# Patient Record
Sex: Male | Born: 1945 | Race: White | Hispanic: No | Marital: Married | Smoking: Former smoker
Health system: Southern US, Community
[De-identification: ages and names within clinical notes are randomized; demographics above are authoritative.]

## PROBLEM LIST (undated history)

## (undated) DIAGNOSIS — I10 Essential (primary) hypertension: Secondary | ICD-10-CM

## (undated) DIAGNOSIS — Z9989 Dependence on other enabling machines and devices: Secondary | ICD-10-CM

## (undated) DIAGNOSIS — E7800 Pure hypercholesterolemia, unspecified: Secondary | ICD-10-CM

## (undated) DIAGNOSIS — G4733 Obstructive sleep apnea (adult) (pediatric): Secondary | ICD-10-CM

## (undated) DIAGNOSIS — K859 Acute pancreatitis without necrosis or infection, unspecified: Secondary | ICD-10-CM

## (undated) DIAGNOSIS — J449 Chronic obstructive pulmonary disease, unspecified: Secondary | ICD-10-CM

## (undated) DIAGNOSIS — R0602 Shortness of breath: Secondary | ICD-10-CM

## (undated) HISTORY — PX: CHOLECYSTECTOMY: SHX55

## (undated) HISTORY — DX: Chronic obstructive pulmonary disease, unspecified: J44.9

## (undated) HISTORY — PX: HERNIA REPAIR: SHX51

---

## 2001-02-20 ENCOUNTER — Encounter: Admission: RE | Admit: 2001-02-20 | Discharge: 2001-02-20 | Payer: Self-pay | Admitting: *Deleted

## 2007-06-23 ENCOUNTER — Ambulatory Visit (HOSPITAL_COMMUNITY): Admission: RE | Admit: 2007-06-23 | Discharge: 2007-06-24 | Payer: Self-pay | Admitting: Neurosurgery

## 2011-01-12 NOTE — Op Note (Signed)
NAMEJARETT, Clinton Young NO.:  [id number]   MEDICAL RECORD NO.:  [id number]          PATIENT TYPE:  OIB   LOCATION:  3172                         FACILITY:  [facility]   PHYSICIAN:  Donalee Citrin, M.D.        DATE OF BIRTH:  1946/01/08   DATE OF PROCEDURE:  06/23/2007  DATE OF DISCHARGE:                               OPERATIVE REPORT   PREOPERATIVE DIAGNOSIS:  Left sided L4-L5 radiculopathy from lumbar  stenosis, lateral recess stenosis at L4-5.   PROCEDURE:  Decompressive laminectomy L4-5 with microscopic dissection  of the left L4 and L5 nerve roots all on the left side only.   SURGEON:  Donalee Citrin, M.D.   ASSISTANT:  Reinaldo Meeker, M.D..   ANESTHESIA:  General endotracheal.   HISTORY OF PRESENT ILLNESS:  The patient is a very pleasant 65 year old  gentleman who has had longstanding left back, hip and posterior thigh  pain down to his ankle.  Patient has failed all forms of conservative  treatment with epidural steroid injections, physical therapy,  antiinflammatories __________ and preoperative imaging with MRI scan  showed lumbar spondylosis with lateral recess stenosis at L4-5 causing  marked foraminal stenosis at the L4 and L5 nerve roots.  The patient was  recommended laminectomy and foraminotomies of this level and side and  the risks and benefits of the operation were explained.  The patient  understood and agreed to proceed forward.   The patient was brought in the OR, was induced under general anesthesia,  placed supine on the Wilson frame.  The back was prepped and draped in  the usual sterile fashion.  Preoperative x-ray localized at the  appropriate level and after infiltration of 8 mL of Xylocaine with  epinephrine, a midline incision was made.  Bovie electrocautery was used  to take down subcutaneous tissue.  Subperiosteal dissection carried down  to lamina of L4 and L5.  Intraoperative x-ray confirmed localization to  the appropriate level and then  the entire lamina of L4 medial facet  complex, superior aspect of L5 was drilled down and 2 and 3 mm Kerrison  punches were used to complete the unilateral laminectomy centrally at  L4, exposed ligamentum flavum was remarkably hypertrophied, through the  undersurface of the 3 lamina to expose and enable palpation of the L3  pedicle.  Attention then taken marching inferiorly.  There was noted to  be marked ligamentous hypertrophy and severe facet arthropathy at disk  space and the thecal sac and proximal L5 nerve root as well as the  undersurface of the 4 root.  This was all teased away with a 4 Penfield  and removed in a piecemeal fashion.  There was noted to be marked  displacement and compression at the 5 root at the proximal aspect of the  foramen.  After the plane was developed, the foraminotomy was extended,  decompressing the roof in the L5 nerve root.  At the end of the  laminotomy, there was no further stenosis on the 5 root or the thecal  sac centrally.  Attention taken to mark  the L4 nerve root.  The superior  aspect of the facet was underbitten and undersurface of the pars  underbitten to identify the proximal takeoff of the L4 root so it could  be easily palpated and then once the L4 nerve root was identified and  visualized, a coronary dilator and hockey stick was easily passed up the  foramen.  The disk space was inspected.  It was noted to be spondylitic  but not overly compressive, so it was left alone.  The remainder of the  medial facet complex was underbitten and cleaned off.  The wound was  then copiously irrigated and meticulous hemostasis was maintained.  Gelfoam was laid on top of the  dura.  Muscle and fascia reapproximated in layers with interrupted  Vicryl and the skin was closed with a running 4-0 subcuticular.  Benzoin  and Steri-Strips applied.  The patient went to the recovery room in  stable condition.  At the end of the case, needle and sponge count   correct.           ______________________________  Donalee Citrin, M.D.     GC/MEDQ  D:  06/23/2007  T:  06/23/2007  Job:  [id number]

## 2011-06-09 LAB — CBC
HCT: 44
Hemoglobin: 14.7
MCHC: 33.3
MCV: 87.2
Platelets: 246
RBC: 5.04
RDW: 14.9 — ABNORMAL HIGH
WBC: 7.7

## 2011-06-09 LAB — BASIC METABOLIC PANEL
BUN: 15
CO2: 30
Calcium: 9.7
Chloride: 96
Creatinine, Ser: 1.26
GFR calc Af Amer: >60
GFR calc non Af Amer: 58 — ABNORMAL LOW
Glucose, Bld: 163 — ABNORMAL HIGH
Potassium: 3.8
Sodium: 138

## 2011-06-21 ENCOUNTER — Emergency Department (HOSPITAL_COMMUNITY): Payer: Medicare Other

## 2011-06-21 ENCOUNTER — Inpatient Hospital Stay (HOSPITAL_COMMUNITY)
Admission: EM | Admit: 2011-06-21 | Discharge: 2011-06-26 | DRG: 418 | Disposition: A | Discharge status: Home / Self Care | Payer: Medicare Other | Attending: Internal Medicine | Admitting: Internal Medicine

## 2011-06-21 DIAGNOSIS — K7689 Other specified diseases of liver: Secondary | ICD-10-CM | POA: Diagnosis present

## 2011-06-21 DIAGNOSIS — K8010 Calculus of gallbladder with chronic cholecystitis without obstruction: Secondary | ICD-10-CM | POA: Diagnosis present

## 2011-06-21 DIAGNOSIS — F3289 Other specified depressive episodes: Secondary | ICD-10-CM | POA: Diagnosis present

## 2011-06-21 DIAGNOSIS — E871 Hypo-osmolality and hyponatremia: Secondary | ICD-10-CM | POA: Diagnosis present

## 2011-06-21 DIAGNOSIS — E119 Type 2 diabetes mellitus without complications: Secondary | ICD-10-CM | POA: Diagnosis present

## 2011-06-21 DIAGNOSIS — N179 Acute kidney failure, unspecified: Secondary | ICD-10-CM | POA: Diagnosis present

## 2011-06-21 DIAGNOSIS — K859 Acute pancreatitis without necrosis or infection, unspecified: Principal | ICD-10-CM | POA: Diagnosis present

## 2011-06-21 DIAGNOSIS — I10 Essential (primary) hypertension: Secondary | ICD-10-CM | POA: Diagnosis present

## 2011-06-21 DIAGNOSIS — R112 Nausea with vomiting, unspecified: Secondary | ICD-10-CM | POA: Diagnosis present

## 2011-06-21 DIAGNOSIS — G4733 Obstructive sleep apnea (adult) (pediatric): Secondary | ICD-10-CM | POA: Diagnosis present

## 2011-06-21 DIAGNOSIS — M109 Gout, unspecified: Secondary | ICD-10-CM | POA: Diagnosis present

## 2011-06-21 DIAGNOSIS — F329 Major depressive disorder, single episode, unspecified: Secondary | ICD-10-CM | POA: Diagnosis present

## 2011-06-21 DIAGNOSIS — E785 Hyperlipidemia, unspecified: Secondary | ICD-10-CM | POA: Diagnosis present

## 2011-06-21 DIAGNOSIS — N289 Disorder of kidney and ureter, unspecified: Secondary | ICD-10-CM | POA: Diagnosis present

## 2011-06-21 LAB — GLUCOSE, CAPILLARY
Glucose-Capillary: 179 mg/dL — ABNORMAL HIGH (ref 70–99)
Glucose-Capillary: 176 mg/dL — ABNORMAL HIGH (ref 70–99)
Glucose-Capillary: 150 mg/dL — ABNORMAL HIGH (ref 70–99)

## 2011-06-21 LAB — CBC
MCH: 28.4 pg (ref 26.0–34.0)
Platelets: 214 10*3/uL (ref 150–400)
RBC: 4.61 MIL/uL (ref 4.22–5.81)
WBC: 11.3 10*3/uL — ABNORMAL HIGH (ref 4.0–10.5)

## 2011-06-21 LAB — DIFFERENTIAL
Basophils Absolute: 0 10*3/uL (ref 0.0–0.1)
Basophils Relative: 0 % (ref 0–1)
Eosinophils Absolute: 0 10*3/uL (ref 0.0–0.7)
Neutrophils Relative %: 93 % — ABNORMAL HIGH (ref 43–77)

## 2011-06-21 LAB — URINALYSIS, ROUTINE W REFLEX MICROSCOPIC
Leukocytes, UA: NEGATIVE
Nitrite: NEGATIVE
Protein, ur: NEGATIVE mg/dL
Specific Gravity, Urine: 1.017 (ref 1.005–1.030)
Urobilinogen, UA: 2 mg/dL — ABNORMAL HIGH (ref 0.0–1.0)

## 2011-06-21 LAB — LACTIC ACID, PLASMA: Lactic Acid, Venous: 1.5 mmol/L (ref 0.5–2.2)

## 2011-06-21 LAB — COMPREHENSIVE METABOLIC PANEL
CO2: 29 mEq/L (ref 19–32)
Calcium: 9.5 mg/dL (ref 8.4–10.5)
Creatinine, Ser: 1.56 mg/dL — ABNORMAL HIGH (ref 0.50–1.35)
GFR calc Af Amer: 52 mL/min — ABNORMAL LOW (ref >90)
GFR calc non Af Amer: 45 mL/min — ABNORMAL LOW (ref >90)
Glucose, Bld: 226 mg/dL — ABNORMAL HIGH (ref 70–99)
Total Protein: 6.9 g/dL (ref 6.0–8.3)

## 2011-06-21 MED ORDER — IOHEXOL 300 MG/ML  SOLN
100.0000 mL | Freq: Once | INTRAMUSCULAR | Status: AC | PRN
  Administered 2011-06-21: 100 mL via INTRAVENOUS

## 2011-06-22 LAB — CBC
MCV: 89.5 fL (ref 78.0–100.0)
Platelets: 174 10*3/uL (ref 150–400)
RDW: 16 % — ABNORMAL HIGH (ref 11.5–15.5)
WBC: 5.7 10*3/uL (ref 4.0–10.5)

## 2011-06-22 LAB — DIFFERENTIAL
Eosinophils Absolute: 0.1 10*3/uL (ref 0.0–0.7)
Eosinophils Relative: 2 % (ref 0–5)
Lymphs Abs: 0.4 10*3/uL — ABNORMAL LOW (ref 0.7–4.0)

## 2011-06-22 LAB — COMPREHENSIVE METABOLIC PANEL
Albumin: 3 g/dL — ABNORMAL LOW (ref 3.5–5.2)
BUN: 15 mg/dL (ref 6–23)
Creatinine, Ser: 1.38 mg/dL — ABNORMAL HIGH (ref 0.50–1.35)
Potassium: 3.8 mEq/L (ref 3.5–5.1)
Total Protein: 6.2 g/dL (ref 6.0–8.3)

## 2011-06-22 LAB — LIPID PANEL: LDL Cholesterol: 50 mg/dL (ref 0–99)

## 2011-06-22 LAB — PHOSPHORUS: Phosphorus: 2.7 mg/dL (ref 2.3–4.6)

## 2011-06-22 LAB — PROTIME-INR
INR: 1.13 (ref 0.00–1.49)
Prothrombin Time: 14.7 seconds (ref 11.6–15.2)

## 2011-06-22 LAB — HEMOGLOBIN A1C: Mean Plasma Glucose: 160 mg/dL — ABNORMAL HIGH (ref <117)

## 2011-06-22 LAB — LIPASE, BLOOD: Lipase: 34 U/L (ref 11–59)

## 2011-06-22 LAB — GLUCOSE, CAPILLARY
Glucose-Capillary: 156 mg/dL — ABNORMAL HIGH (ref 70–99)
Glucose-Capillary: 196 mg/dL — ABNORMAL HIGH (ref 70–99)
Glucose-Capillary: 147 mg/dL — ABNORMAL HIGH (ref 70–99)

## 2011-06-22 LAB — MAGNESIUM: Magnesium: 2 mg/dL (ref 1.5–2.5)

## 2011-06-22 NOTE — H&P (Signed)
NAMEANDREN, BETHEA NO.:  [id number]  MEDICAL RECORD NO.:  [id number]  LOCATION:  1513                         FACILITY:  [facility]  PHYSICIAN:  Kathlen Mody, MD       DATE OF BIRTH:  1945-12-14  DATE OF ADMISSION:  06/21/2011 DATE OF DISCHARGE:                             HISTORY & PHYSICAL   PRIMARY CARE PHYSICIAN:  None.  CHIEF COMPLAINT:  Right upper quadrant pain since last night.  HISTORY OF PRESENT ILLNESS:  This is a 65 year old gentleman with a history of hypertension, diabetes, sleep apnea, morbid obesity, came into [facility] ER with severe 8/10 abdominal pain, generalized but more in the right upper quadrant, nonradiating, constant, associated with nausea and 1 episode of nonbilious nonbloody vomiting.  Denies any diarrhea or constipation.  Last bowel movement was yesterday.  Over the last few weeks, the patient has mild right upper quadrant pain.  The patient on arrival was found to be febrile with temperature of 101. Denies and any cough or chest pain.  He has shortness of breath on exertion at baseline.  The patient is morbidly obese.  The patient denies any urinary complaints.  Denies any headache or blurry vision. Denies any tingling or numbness.  REVIEW OF SYSTEMS:  See HPI, otherwise negative.  PAST MEDICAL HISTORY: 1. Hypertension. 2. Spinal stenosis. 3. Diabetes. 4. Hyperlipidemia. 5. Sleep apnea. 6. Depression.  PAST SURGICAL HISTORY:  History of laminectomy at L4-L5, hernia repair, umbilical hernia.  ALLERGIES:  No known drug allergies.  FAMILY HISTORY:  Significant for coronary artery disease in his sister.  HOME MEDICATIONS: 1. Aspirin 81 mg. 2. Vitamin D3 1 tablet daily. 3. Tramadol 50 mg as needed. 4. Vitamin B12, 2000 mcg daily. 5. Pravastatin 40 mg 1 tablet daily. 6. Potassium chloride 20 mEq 1 tablet daily. 7. Metformin 1000 mg 1 tablet twice a day. 8. Lisinopril 10 mg half a tablet daily. 9. Ipratropium  inhaler. 10.Hydrochlorothiazide 25 mg 1 tablet daily. 11.Glipizide 5 mg 1 tablet daily twice with meals. 12.Linezolid nasal inhalation twice daily. 13.Bupropion 150 mg twice daily. 14.Aspirin 81 mg daily. 15.Allopurinol 100 mg 2 tablets daily.  PHYSICAL EXAMINATION:  VITAL SIGNS:  On arrival to the ER, he was found to have a temperature of 101, which came down to 99, respiratory rate of 18, pulse is 90, blood pressure 120/90, and saturating about 96% on 2 L of oxygen. GENERAL:  On exam, he is alert, afebrile, in mild distress from abdominal pain. HEENT:  Pupils reacting to light and accommodation.  Mild scleral icterus.  Dry mucous membranes. NECK:  JVD cannot be appreciated. CARDIOVASCULAR:  S1 and S2 heard. RESPIRATORY:  Decreased air entry bilateral, more on the right side. ABDOMEN:  Obese, soft, and generalized tenderness, more in the right upper quadrant.  No rebound tenderness.  No signs of peritonitis.  Bowel sounds are heard. EXTREMITIES:  Trace edema present.  No cyanosis or clubbing. NEUROLOGICAL:  Grossly intact.  PERTINENT LABORATORY DATA:  Comprehensive metabolic panel significant for sodium of 133, glucose of 226, creatinine of 1.56, bilirubin of 2.8, alkaline phosphatase of 250, AST of 236, ALT of 240, and lipase of 2224.  Urinalysis, negative for nitrites and leukocytes.  CBC significant for a slightly elevated WBC count of 11.3, hemoglobin of 13.1, hematocrit of 39.3, and platelets 240.  DIAGNOSTIC STUDIES:  He had a CT abdomen and pelvis done, which showed opacity within the right middle lobe on the superior most aspect of the lung windows, cannot be further characterized on this study, may represent scar versus atelectasis versus infiltrate, cholelithiasis, sigmoid diverticulosis.  No evidence of diverticulitis.  There is no gallbladder thickening or pericholecystic fluid.  No extra or intrahepatic biliary ductal dilatation.  Ultrasound of the abdomen shows  cholelithiasis without associated findings to suggest acute cholecystitis and hepatic steatosis.  ASSESSMENT AND PLAN: 1. This is a 65 year old gentleman with history of hypertension,     diabetes, and sleep apnea, admitted for abdominal pain, found to     have an elevated lipase is being admitted to hospitalist service     for evaluation and management of pancreatitis with CAT scan and     ultrasound showing multiple gallstones, could be secondary to     gallstone pancreatitis, but CBG does not appear to be dilated.  He     must have passed the stone. 2. We will start the patient on clear liquid diet, IV fluids, pain     control, and advance diet as tolerated. 3. Elevated LFTs, probably secondary to liver steatosis.  We will     repeat LFTs in a.m., hold pravastatin for now. 4. Elevated creatinine, not sure if this is his baseline or may be     this is acute renal insufficiency.  For now, we are going to hold     the metformin and lisinopril and see if his creatinine improves     with IV fluids in the next 24 to 48 hours.  Also hold tramadol. 5. Hypertension appears to be controlled. 6. Sleep apnea.  CPAP at night. 7. Deep venous thrombosis prophylaxis, subcutaneous Lovenox. 8. The patient is full code.          ______________________________ Kathlen Mody, MD     VA/MEDQ  D:  06/21/2011  T:  06/21/2011  Job:  [id number]  Electronically Signed by Kathlen Mody MD on 06/22/2011 03:05:39 PM

## 2011-06-23 DIAGNOSIS — K859 Acute pancreatitis without necrosis or infection, unspecified: Secondary | ICD-10-CM

## 2011-06-23 DIAGNOSIS — K8020 Calculus of gallbladder without cholecystitis without obstruction: Secondary | ICD-10-CM

## 2011-06-23 DIAGNOSIS — R110 Nausea: Secondary | ICD-10-CM

## 2011-06-23 LAB — COMPREHENSIVE METABOLIC PANEL
Albumin: 2.8 g/dL — ABNORMAL LOW (ref 3.5–5.2)
BUN: 14 mg/dL (ref 6–23)
Creatinine, Ser: 1.25 mg/dL (ref 0.50–1.35)
GFR calc Af Amer: 68 mL/min — ABNORMAL LOW (ref >90)
Total Bilirubin: 1.2 mg/dL (ref 0.3–1.2)
Total Protein: 5.9 g/dL — ABNORMAL LOW (ref 6.0–8.3)

## 2011-06-23 LAB — GLUCOSE, CAPILLARY
Glucose-Capillary: 150 mg/dL — ABNORMAL HIGH (ref 70–99)
Glucose-Capillary: 206 mg/dL — ABNORMAL HIGH (ref 70–99)
Glucose-Capillary: 144 mg/dL — ABNORMAL HIGH (ref 70–99)

## 2011-06-23 NOTE — Discharge Summary (Signed)
Clinton Young, Clinton Young NO.:  [id number]  MEDICAL RECORD NO.:  [id number]  LOCATION:  1513                         FACILITY:  [facility]  PHYSICIAN:  Kathlen Mody, MD       DATE OF BIRTH:  15-Aug-1946  DATE OF ADMISSION:  06/21/2011 DATE OF DISCHARGE:  06/23/2011                              DISCHARGE SUMMARY   Date of discharge possibly June 23, 2011.  PRIMARY CARE PHYSICIAN:  None.  DISCHARGE DIAGNOSES: 1. Gallstone pancreatitis, must have passed a stone. 2. Cholelithiasis. 3. Elevated liver function test secondary to liver steatosis. 4. Hypertension. 5. Diabetes. 6. Hyperlipidemia. 7. Depression. 8. Gout. 9. Renal insufficiency.  CONSULTATIONS:  None.  PROCEDURES:  None.  PERTINENT LABORATORY DATA:  On admission, the patient had a comprehensive metabolic panel significant for sodium of 133, creatinine 1.56, glucose 226.  Elevated alkaline phosphatase of 250, AST 236, ALT 240, total bilirubin 2.8, lipase 2224.  Urinalysis negative for nitrites and leukocytes.  CBC showed a slight elevation of white count to 11.3. Lactic acid was normal.  BNP was 527.  Repeat labs showed a normal lipase level the next day, the comprehensive metabolic panel showed decrease in LFTs with alkaline phosphatase of 190, AST 114, ALT 204, bilirubin still at 3.  Hemoglobin A1c of 7.2.  TSH of 0.808.  Lipid profile showing HDL of 30.  DIAGNOSTIC STUDIES:  The patient had a 2-view chest x-ray that showed bibasilar scarring, otherwise clear.  CT abdomen and pelvis with contrast shows opacity within the right middle lobe and the superior- most cuts of the lung windows, possible scar versus atelectasis versus infiltrate cholelithiasis.  Sigmoid diverticulosis without evidence of diverticulitis.  Ultrasound of the abdomen shows cholelithiasis, no gallbladder thickening or pericholecystic fluid, no sonographic evidence of Murphy sign, common bile duct measures 5 mm.  BRIEF  HOSPITAL COURSE:  This is a 65 year old gentleman with history of hypertension, diabetes, sleep apnea, morbid obesity, admitted for right upper quadrant pain associated with nausea and vomiting.  He was found to have elevated lipase in the 2000s and elevated LFTs.  He was admitted for further evaluation and management of acute pancreatitis, possibly gallstone pancreatitis.  CT, abdomen and pelvis did not show any CBD stones or any choledocholithiasis.  Over the next 24 hours, his lipase had normalized.  His abdominal pain, nausea, and vomiting almost resolved, apparently must have passed a gallstone.  His LFTs are improving, but they are still elevated.  We will check his LFTs in the morning; if they are trending down he can be discharged home to follow up with his PCP and further referral to a gastroenterologist to follow his LFTs.  If his LFTs remain high, we will get a hepatitis panel and further recommendations as per the clinical course. 1. Hypertension, controlled.  Continue with his home medications. 2. Hyponatremia.  Mild, most likely secondary to hydrochlorothiazide,     which we have held. 3. Diabetes.  Continue with sliding scale insulin.  Metformin was held     secondary to elevated creatinine. 4. Renal insufficiency.  Not sure if it is his baseline or if it is     acute-on-chronic renal  insufficiency.  His creatinine has improved     in 24 hours with IV fluids.  We will check his renal function in     a.m.; if it has normalized we can restart his metformin on     discharge. 5. Abnormal chest x-ray.  Initially, we thought the patient had     attached pneumonia, but on further questioning the patient was told     he had similar abnormal chest x-ray findings about 3 months ago, by     his [place] doctor.  His leukocytosis has resolved.  He has been     afebrile.  We can discontinue the antibiotics on discharge.  DISCHARGE MEDICATIONS:  To be dictated by the discharging  physician.          ______________________________ Kathlen Mody, MD     VA/MEDQ  D:  06/22/2011  T:  06/22/2011  Job:  [id number]  Electronically Signed by Kathlen Mody MD on 06/23/2011 11:13:19 PM

## 2011-06-24 DIAGNOSIS — K8020 Calculus of gallbladder without cholecystitis without obstruction: Secondary | ICD-10-CM

## 2011-06-24 DIAGNOSIS — K859 Acute pancreatitis without necrosis or infection, unspecified: Secondary | ICD-10-CM

## 2011-06-24 LAB — CBC
HCT: 36.2 % — ABNORMAL LOW (ref 39.0–52.0)
MCH: 27.8 pg (ref 26.0–34.0)
MCV: 88.3 fL (ref 78.0–100.0)
Platelets: 165 10*3/uL (ref 150–400)
RBC: 4.1 MIL/uL — ABNORMAL LOW (ref 4.22–5.81)

## 2011-06-24 LAB — COMPREHENSIVE METABOLIC PANEL
ALT: 108 U/L — ABNORMAL HIGH (ref 0–53)
AST: 46 U/L — ABNORMAL HIGH (ref 0–37)
BUN: 12 mg/dL (ref 6–23)
CO2: 27 mEq/L (ref 19–32)
Calcium: 8.7 mg/dL (ref 8.4–10.5)
Creatinine, Ser: 1.22 mg/dL (ref 0.50–1.35)
GFR calc Af Amer: 70 mL/min — ABNORMAL LOW (ref >90)
GFR calc non Af Amer: 60 mL/min — ABNORMAL LOW (ref >90)
Glucose, Bld: 136 mg/dL — ABNORMAL HIGH (ref 70–99)
Sodium: 135 mEq/L (ref 135–145)
Total Bilirubin: 0.8 mg/dL (ref 0.3–1.2)

## 2011-06-24 LAB — GLUCOSE, CAPILLARY
Glucose-Capillary: 148 mg/dL — ABNORMAL HIGH (ref 70–99)
Glucose-Capillary: 145 mg/dL — ABNORMAL HIGH (ref 70–99)

## 2011-06-25 ENCOUNTER — Other Ambulatory Visit (INDEPENDENT_AMBULATORY_CARE_PROVIDER_SITE_OTHER): Payer: Self-pay | Admitting: General Surgery

## 2011-06-25 DIAGNOSIS — K8010 Calculus of gallbladder with chronic cholecystitis without obstruction: Secondary | ICD-10-CM

## 2011-06-25 DIAGNOSIS — K824 Cholesterolosis of gallbladder: Secondary | ICD-10-CM

## 2011-06-25 LAB — GLUCOSE, CAPILLARY
Glucose-Capillary: 138 mg/dL — ABNORMAL HIGH (ref 70–99)
Glucose-Capillary: 216 mg/dL — ABNORMAL HIGH (ref 70–99)
Glucose-Capillary: 246 mg/dL — ABNORMAL HIGH (ref 70–99)

## 2011-06-25 LAB — CBC
HCT: 36.4 % — ABNORMAL LOW (ref 39.0–52.0)
Hemoglobin: 11.7 g/dL — ABNORMAL LOW (ref 13.0–17.0)
MCH: 27.9 pg (ref 26.0–34.0)
MCV: 86.9 fL (ref 78.0–100.0)
RBC: 4.19 MIL/uL — ABNORMAL LOW (ref 4.22–5.81)
WBC: 3.7 10*3/uL — ABNORMAL LOW (ref 4.0–10.5)

## 2011-06-25 LAB — BASIC METABOLIC PANEL
BUN: 14 mg/dL (ref 6–23)
CO2: 28 mEq/L (ref 19–32)
Calcium: 9.2 mg/dL (ref 8.4–10.5)
Chloride: 100 mEq/L (ref 96–112)
Creatinine, Ser: 1.06 mg/dL (ref 0.50–1.35)
Glucose, Bld: 160 mg/dL — ABNORMAL HIGH (ref 70–99)

## 2011-06-26 LAB — HEPATIC FUNCTION PANEL
Albumin: 3 g/dL — ABNORMAL LOW (ref 3.5–5.2)
Alkaline Phosphatase: 182 U/L — ABNORMAL HIGH (ref 39–117)
Indirect Bilirubin: 0.4 mg/dL (ref 0.3–0.9)
Total Bilirubin: 0.6 mg/dL (ref 0.3–1.2)
Total Protein: 6.6 g/dL (ref 6.0–8.3)

## 2011-06-26 LAB — CBC
HCT: 37.7 % — ABNORMAL LOW (ref 39.0–52.0)
Hemoglobin: 11.8 g/dL — ABNORMAL LOW (ref 13.0–17.0)
MCH: 27.5 pg (ref 26.0–34.0)
MCHC: 31.3 g/dL (ref 30.0–36.0)
RDW: 15.8 % — ABNORMAL HIGH (ref 11.5–15.5)

## 2011-06-26 LAB — GLUCOSE, CAPILLARY: Glucose-Capillary: 162 mg/dL — ABNORMAL HIGH (ref 70–99)

## 2011-06-26 NOTE — Consult Note (Signed)
Clinton Young, Clinton Young NO.:  [id number]  MEDICAL RECORD NO.:  [id number]  LOCATION:  1513                         FACILITY:  [facility]  PHYSICIAN:  Juanetta Gosling, MDDATE OF BIRTH:  1946-08-09  DATE OF CONSULTATION: DATE OF DISCHARGE:                                CONSULTATION   CONSULTING PHYSICIAN:  Kathlen Mody, MD  Consulting reason is for gallstone pancreatitis.  GASTROENTEROLOGIST:  Rachael Fee, MD  This is a 65 year old male began having abdominal pain on Sunday night that he had never had before.  He had a subjective fever as well as some nausea.  He denied any emesis.  There was no relieving factors, so he decided to come to the emergency room as it continued to get worse.  He has been admitted since June 21, 2011.  At this point his initial laboratory evaluation showed him to have a total bilirubin of 2.8, alkaline phosphatase 250, AST 236, ALT 240, and creatinine of 1.56.  His lipase on admission was 2224.  His white count upon admission was 11.3. He underwent a CT scan of his abdomen, which showed gallstones that are present as well as some sigmoid diverticulosis.  He also has undergone an ultrasound his abdomen which shows cholelithiasis with no gallbladder wall thickening, pericholecystic fluid, or sonographic Murphy sign.  His common bile duct measured 5 mm and no other findings that are noted.  He has been managed conservatively and his pancreatitis has resolved both clinically and by his laboratory evaluation.  He was evaluated by Gastroenterology and they recommend consulting surgery for cholecystectomy.  PAST MEDICAL HISTORY: 1. COPD. 2. Hypertension. 3. Diabetes. 4. Obstructive sleep apnea, on a CPAP machine.  PAST SURGICAL HISTORY: 1. Lumbar laminectomy. 2. Right groin hernia repair. 3. Umbilical hernia repair, which he thinks mesh was used.  MEDICATIONS: 1. Aspirin. 2. Tramadol. 3. Pravastatin. 4. Metformin. 5.  Lisinopril. 6. Hydrochlorothiazide. 7. Glipizide. 8. Ipratropium. 9. Aspirin. 10.Allopurinol.  ALLERGIES:  None known.  SOCIAL HISTORY:  Occasional alcohol.  He is a former smoker.  He is retired.  He gets most of his primary care at the [place].  PHYSICAL EXAMINATION:  VITAL SIGNS:  97.8, 87, 156/82, 20, and 96% on room air. GENERAL: He is an obese male, in no distress. HEENT:  He has no scleral icterus. NECK:  Supple without adenopathy. LUNGS:  Have occasional expiratory wheezes. HEART:  Regular rate and rhythm. ABDOMEN:  Obese.  He has a well-healed right groin and umbilical incision.  He is nontender and nondistended with bowel sounds that are present.  His last laboratory evaluation shows a PT of 14.7 and INR of 1.13.  His albumin is 2.8, alkaline phosphatase 177, AST 62, ALT 137, total bilirubin 1.2, and creatinine 1.25.  A CT scan and ultrasound both showed cholelithiasis.  ASSESSMENT:  Gallstone pancreatitis.  PLAN:  Pancreatitis is clinic resolved.  I have recommended to him that he would proceed with cholecystectomy prior to his discharge to prevent any further episodes.  I drew a diagram.  We discussed the pathophysiology for the gallstones and pancreatitis as well as pancreatitis secondary to gallstones.  We discussed the risks of  surgery including the medical risks with his other comorbidities as well as bleeding, infection, open procedure, and common bile duct injury or other bowel or vascular injury requiring repair.  I am going to make him n.p.o. tonight and we will attempt to get him to the operating room tomorrow.     Juanetta Gosling, MD     MCW/MEDQ  D:  06/23/2011  T:  06/23/2011  Job:  [id number]  cc:   Kathlen Mody, MD  Rachael Fee, MD [address] [place], [place] [address]  Electronically Signed by Emelia Loron MD on 06/26/2011 11:38:17 AM

## 2011-06-26 NOTE — Op Note (Signed)
NAMEBRAYDIN, Clinton Young NO.:  [id number]  MEDICAL RECORD NO.:  [id number]  LOCATION:  1513                         FACILITY:  [facility]  PHYSICIAN:  Juanetta Gosling, MDDATE OF BIRTH:  February 28, 1946  DATE OF PROCEDURE:  06/25/2011 DATE OF DISCHARGE:                              OPERATIVE REPORT   PREOPERATIVE DIAGNOSIS:  Gallstone pancreatitis, resolved.  POSTOPERATIVE DIAGNOSIS:  Gallstone pancreatitis, resolved.  PROCEDURE:  Laparoscopic cholecystectomy.  SURGEON:  Troy Sine. Dwain Sarna, MD  ASSISTANT:  Letha Cape, PA  ANESTHESIA:  General.  SUPERVISING ANESTHESIOLOGIST:  Jenelle Mages. Fortune, MD  SPECIMENS:  Gallbladder contents to Pathology.  ESTIMATED BLOOD LOSS:  Minimal.  COMPLICATIONS:  None.  DRAINS:  None.  DISPOSITION:  The patient to recovery room in stable condition.  INDICATIONS:  This is a 65 year old male with a recent onset of gallstone pancreatitis.  His pancreatitis has resolved.  We discussed a laparoscopic cholecystectomy prior to discharge with the risks and benefits associated with that.  PROCEDURE IN DETAIL:  After informed consent was obtained, the patient was first administered Unasyn.  He had sequential compression devices on lower extremities prior to induction with anesthesia.  He was then placed under general anesthesia without complication.  His abdomen was prepped and draped in the standard sterile surgical fashion.  Surgical time-out was then performed.  I accessed his abdomen due to a prior umbilical hernia repair via the left upper quadrant using a 5-mm trocar camera with the Optiview technique.  There was no evidence of an entry injury.  His abdomen was insufflated to 15 mmHg pressure.  I then inserted a 10-mm trocar above the umbilicus under direct vision without complication.  Three further 5- mm trocars were placed in epigastrium and right upper quadrant after infiltration of local anesthetic under  direct vision without complication.  We then retracted his gallbladder cephalad and lateral. He had a lot of scarring in his triangle of Calot, indicative of his pancreatitis and also very difficult to get this space due to his body habitus and very large omentum.  Eventually, I was able to dissect the triangle of Calot, identified the artery as well as the cystic duct.  I wanted to do a cholangiogram due to his gallstone pancreatitis, but his gallbladder was very friable and I made a rent in his gallbladder right at the cystic duct junction.  I had the critical view of safety at this point and I elected to just clip the duct and divide it.  At that point, I did not do a cholangiogram as I was beginning to spill some stones while I was doing this.  I was able to control this and I did evacuate all the stones.  I treated the artery in the similar fashion.  I then removed the gallbladder from the liver bed with cautery without difficulty.  This was placed in an Endocatch bag and removed from the umbilicus.  I did place an Endoloop on the cystic duct remnant without difficulty.  Hemostasis was then observed.  Irrigation was performed.  I evacuated all the irrigant as well as all the stones that I could identify as well.  I  then placed a piece of Surgicel SNoW in the liver bed.  I then removed the 10-mm trocar.  I did close this with a #1 Vicryl suture and this completely obliterated the defect.  I then desufflated the abdomen and removed all trocars.  These were closed with 4-0 Monocryl and Dermabond.  He tolerated this well, was extubated in the operating room and transferred to the recovery room.     Juanetta Gosling, MD     MCW/MEDQ  D:  06/25/2011  T:  06/25/2011  Job:  [id number]  cc:   Rachael Fee, MD [address] [place], [place] [address]  Electronically Signed by Emelia Loron MD on 06/26/2011 11:38:29 AM

## 2011-06-30 NOTE — Discharge Summary (Signed)
Clinton, Young NO.:  [id number]  MEDICAL RECORD NO.:  [id number]  LOCATION:  1513                         FACILITY:  [facility]  PHYSICIAN:  Hartley Barefoot, MD    DATE OF BIRTH:  1946/07/06  DATE OF ADMISSION:  06/21/2011 DATE OF DISCHARGE:  06/26/2011                              DISCHARGE SUMMARY   DISCHARGE DIAGNOSES: 1. Gallstone pancreatitis, status post cholecystectomy. 2. Cholecystectomy. 3. Hypertension. 4. Diabetes. 5. Hyperlipidemia. 6. Depression. 7. Gout. 8. Acute kidney injury, resolved now.  DISCHARGE MEDICATIONS: 1. Norvasc 5 mg p.o. daily. 2. Docusate 100 mg p.o. b.i.d. 3. Tramadol 50 mg 1 tablet every 8 hours as needed for pain. 4. Allopurinol 2 tablets by mouth daily. 5. Aspirin 81 mg p.o. daily. 6. Bupropion 150 p.o. b.i.d. 7. Vitamin B12 500 mcg 4 tablets by mouth daily. 8. Flunisolide Nasal 1 spray inhaled b.i.d. 9. Glipizide 500 mg p.o. b.i.d. with meals. 10.Hydrochlorothiazide 1 tab by mouth daily. 11.Ipratropium 2 puffs inhaled b.i.d. 12.Vitamin D3 1000 units 1-2 tab by mouth daily. Medications stopped during this hospitalization:  Lisinopril, metformin, pravastatin, potassium.  DISPOSITION/FOLLOWUP: 1. Clinton Young will need to follow with Dr. Dwain Young, Surgery within 1     or 2 weeks to follow liver function test to see the patient will     require ERCP or further studies. 2. He will need to follow up with his primary care physician at     [facility] for a BMET to follow kidney function.     Consider restart metformin if creatinine less than 1.4 and consider     restart lisinopril if renal function is stable.  HOSPITAL COURSE: 1. Gallstone pancreatitis, status post cholecystectomy.  The patient     was admitted with pancreatitis and increased liver function test.     GI and Surgery was consulted.  The patient had a cholecystectomy on     October 26.  He tolerated the procedure well.  He is  tolerating     diet.  He will be discharged on October 27.  He will need to follow     up with the Surgery to repeat liver function test and if abnormal,     he might need further studies.  He can follow up also with Dr.     Gerilyn Young. 2. Acute kidney injury improved with IV fluids.  I will discharge him     off lisinopril.  He can follow up with his primary care physician.     Consider restart lisinopril if his renal function is stable.  His     creatinine peaked to 1.5 and decreased to 1.0 on October 26.  We     are going to discharge him on his hydrochlorothiazide.  I add     Norvasc for his blood pressure. 3. Diabetes:  We are going to discharge him on glipizide.  I will     continue to hold metformin at this time.  Make sure that his kidney     function is stable, prior to restarting his metformin.  He can     follow up with his primary care physician for this.  On  day of     discharge, the patient was in improved condition. 4. Obstructive sleep apnea.  Continue with CPAP. 5. Morbid obesity.  Counseling was provided.  LABORATORY DATA:  Labs prior to discharge:  On October 26, sodium 136, potassium 4.0, chloride 100, bicarb 28, glucose 160, BUN 14, creatinine 1.06.  White blood cell 7.5, hemoglobin 11.8, platelets 223, AST 85, ALT 159, albumin 3.0, alkaline phosphatase 182, lipase 25.  Vital signs; blood pressure 150/79, saturation 100% on 2 L, respiration 18, pulse 78, temperature 98.0.     Hartley Barefoot, MD     BR/MEDQ  D:  06/26/2011  T:  06/26/2011  Job:  [id number]  Electronically Signed by Hartley Barefoot MD on 06/30/2011 08:58:26 PM

## 2011-07-26 ENCOUNTER — Encounter (INDEPENDENT_AMBULATORY_CARE_PROVIDER_SITE_OTHER): Payer: Self-pay | Admitting: General Surgery

## 2011-07-26 ENCOUNTER — Ambulatory Visit (INDEPENDENT_AMBULATORY_CARE_PROVIDER_SITE_OTHER): Payer: Medicare Other | Admitting: General Surgery

## 2011-07-26 VITALS — BP 122/70 | HR 64 | Temp 98.3°F | Resp 24 | Ht 71.0 in | Wt 292.4 lb

## 2011-07-26 DIAGNOSIS — E119 Type 2 diabetes mellitus without complications: Secondary | ICD-10-CM | POA: Insufficient documentation

## 2011-07-26 DIAGNOSIS — Z09 Encounter for follow-up examination after completed treatment for conditions other than malignant neoplasm: Secondary | ICD-10-CM

## 2011-07-26 DIAGNOSIS — J449 Chronic obstructive pulmonary disease, unspecified: Secondary | ICD-10-CM | POA: Insufficient documentation

## 2011-07-26 NOTE — Progress Notes (Signed)
Subjective:     Patient ID: Clinton Young, male   DOB: [birth year], 65 y.o.   MRN: [id number]  HPI This is a 65 year old male who I met while he was admitted to the hospital for gallstone pancreatitis. He underwent a laparoscopic cholecystectomy after his pancreatitis resolved. He returns today doing well without any significant complaints. He is eating well. He has some occasional loose bowel movements and constipation. Otherwise he is mostly back to his normal activities.  Review of Systems     Objective:   Physical Exam Well healed incisions without infection    Assessment:     S/p lap chole for GSP    Plan:     He is doing well. I return to full activity today. His pathology showed the expected cholelithiasis cholesterolosis. I'm going to see him back as needed.

## 2011-07-26 NOTE — Patient Instructions (Signed)
Return to full activity.  

## 2013-03-04 IMAGING — CR DG CHEST 2V
2 series · 2 of 2 positions shown · non-contrast
Comparison: Chest radiograph performed 06/19/2007

CLINICAL DATA: Shortness of breath; history of smoking.

CHEST - 2 VIEW

[w chest pa]
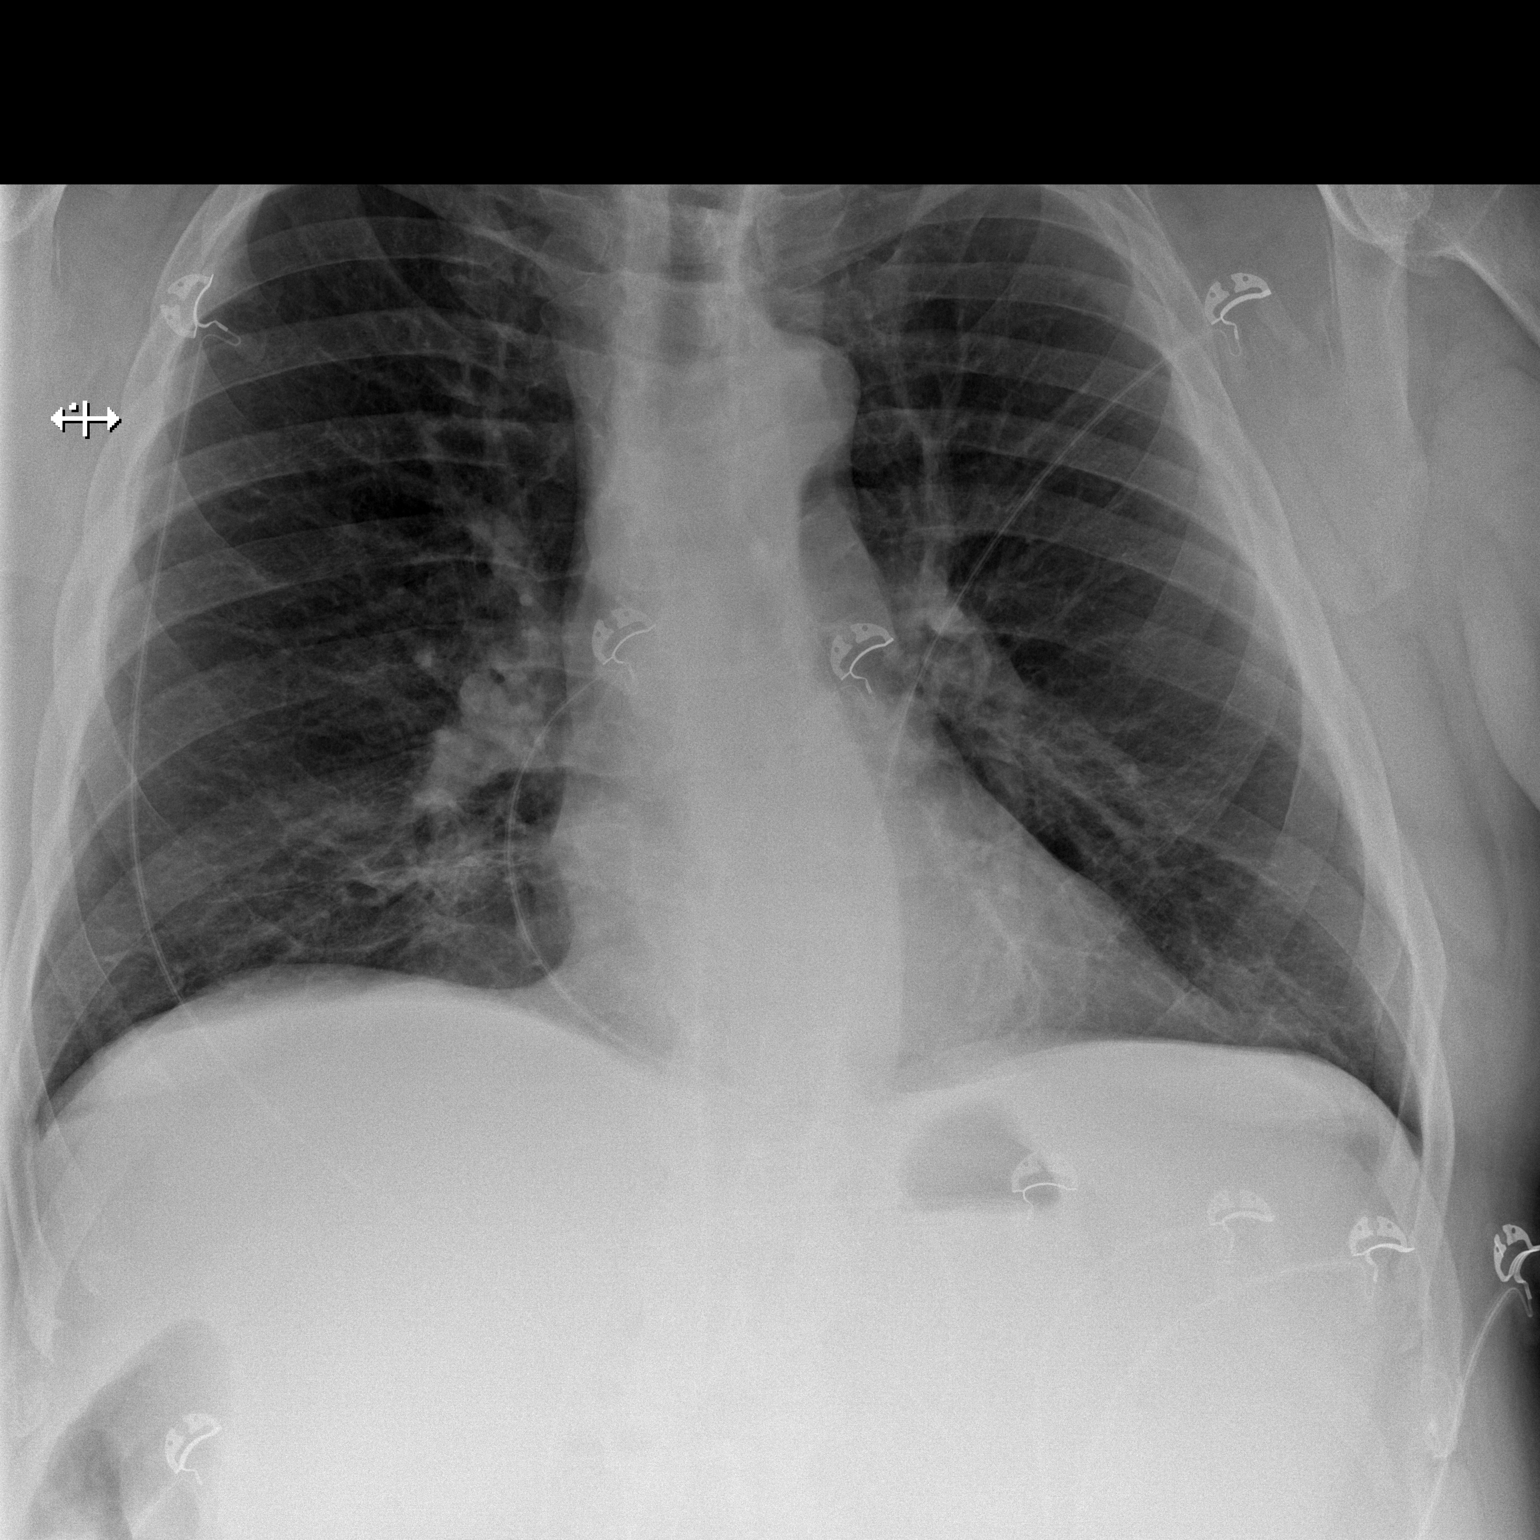

[w chest lat]
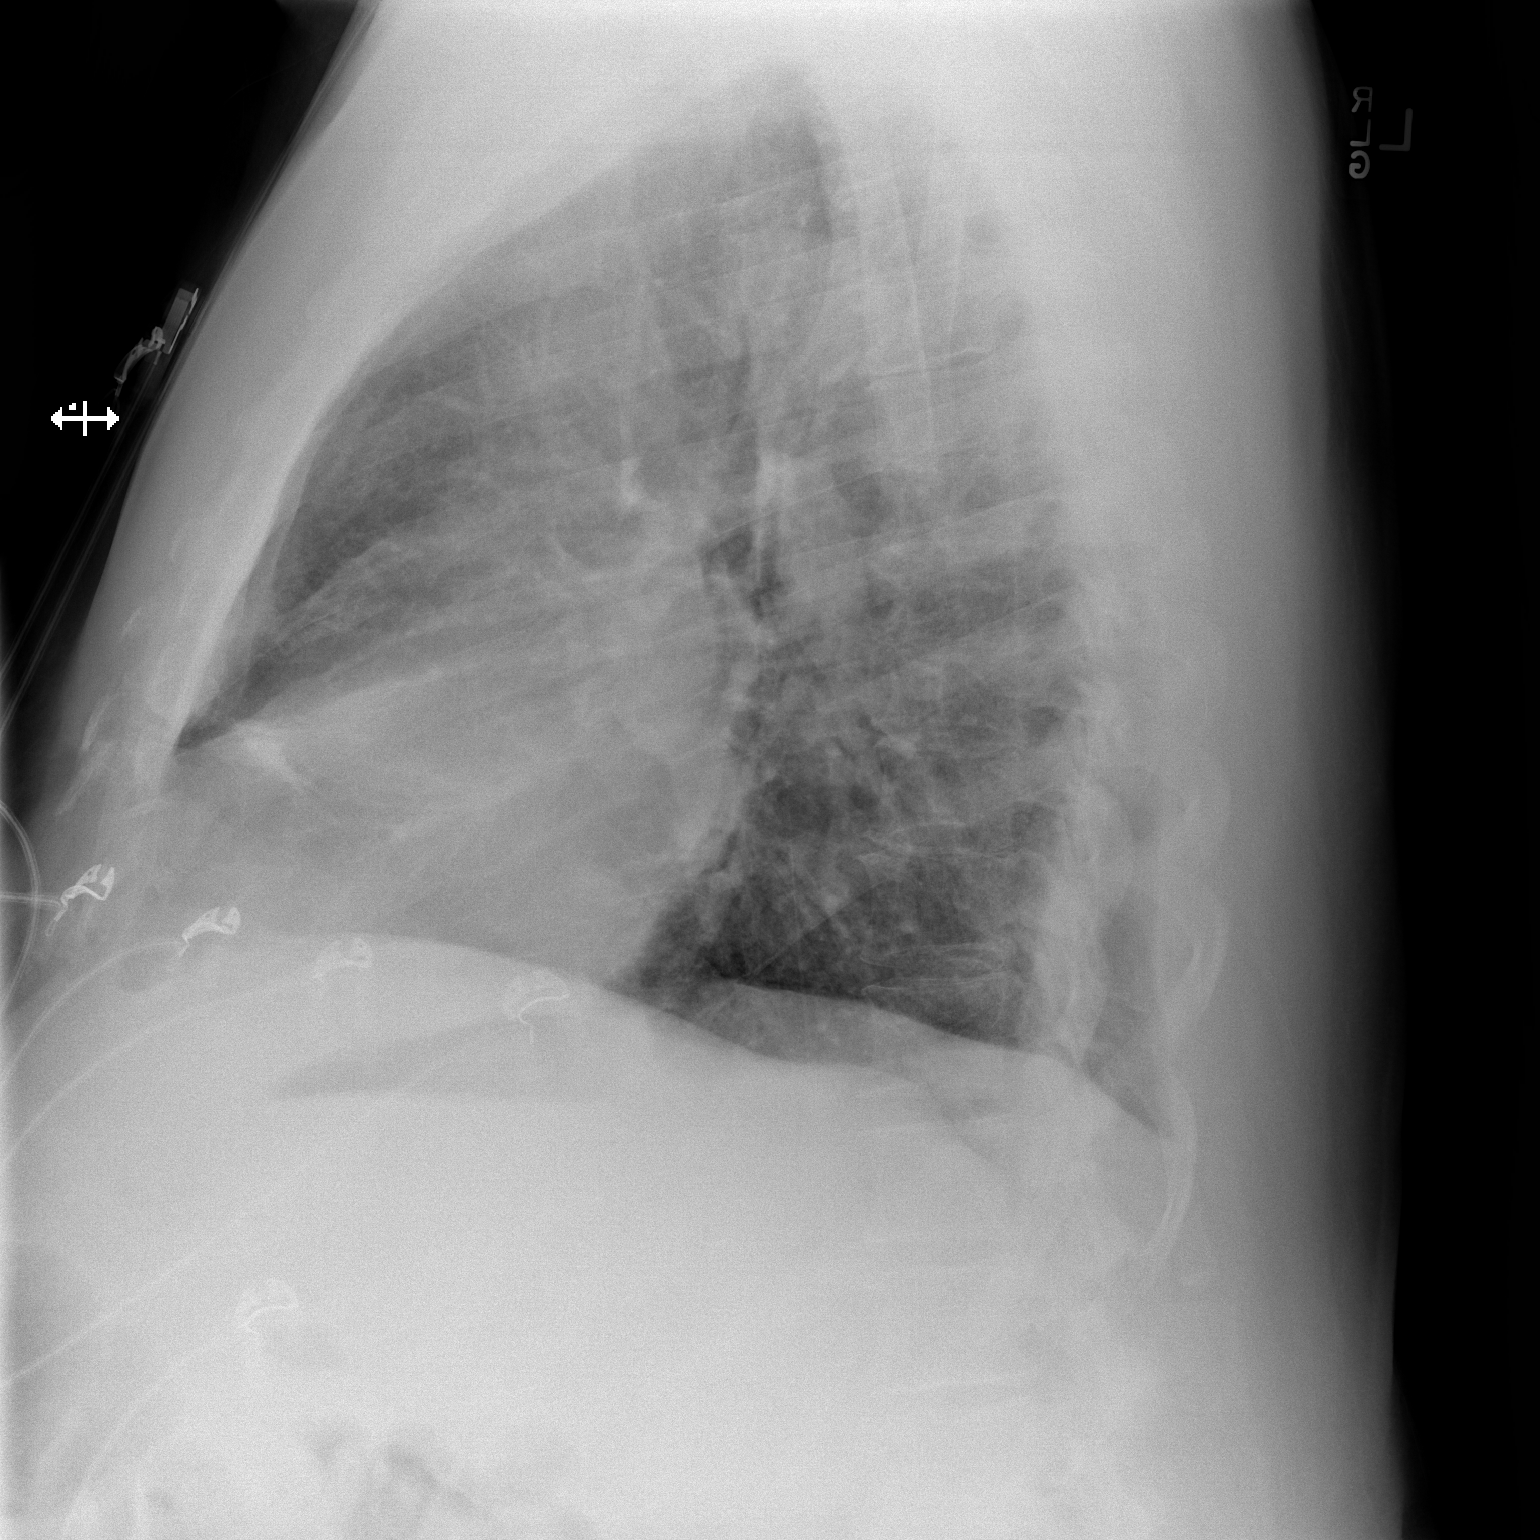

[2 of 2 positions shown; findings below may reference images not displayed]

FINDINGS: The lungs are well-aerated.  Bibasilar scarring is
unchanged in appearance.  The lungs are otherwise grossly clear.
There is no evidence of focal opacification, pleural effusion or
pneumothorax.

The heart is normal in size; the mediastinal contour is within
normal limits.  No acute osseous abnormalities are seen.
IMPRESSION: Stable appearance to bibasilar scarring; lungs otherwise grossly
clear.

## 2013-03-04 IMAGING — US US ABDOMEN PORT
1 series · 14 of 25 positions shown · non-contrast
Comparison: CT dated 06/21/2011

CLINICAL DATA: Abdominal pain, increased LFTs

COMPLETE ABDOMINAL ULTRASOUND

[Series 1: us abdomen port · 0.39mm/px · 14 of 74 slices shown]
[im 1/74]
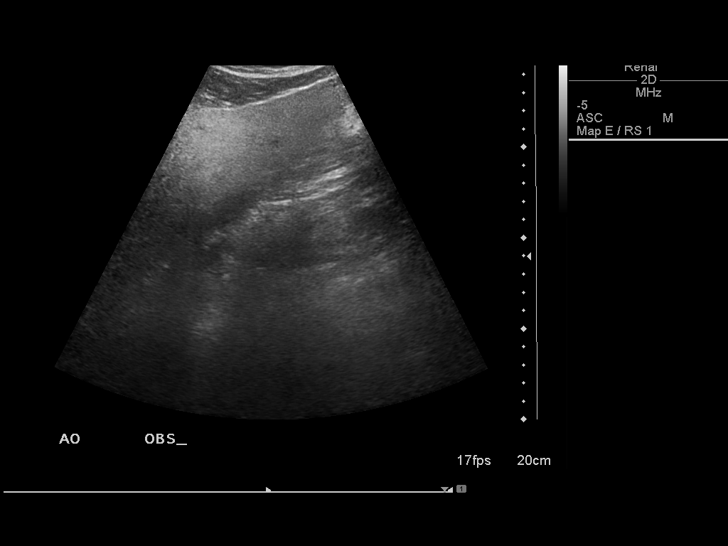
[im 7/74]
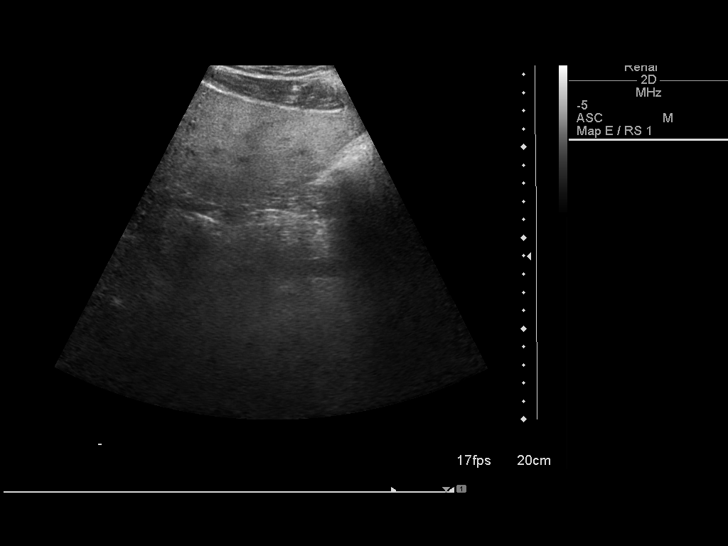
[im 13/74]
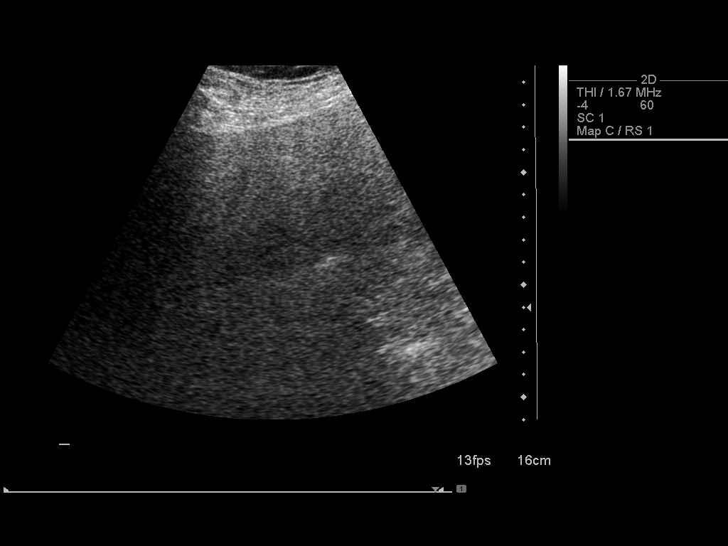
[im 19/74]
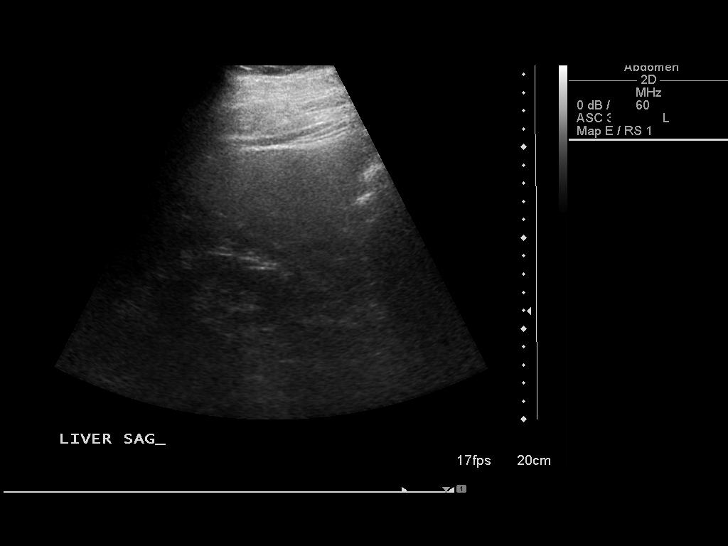
[im 25/74]
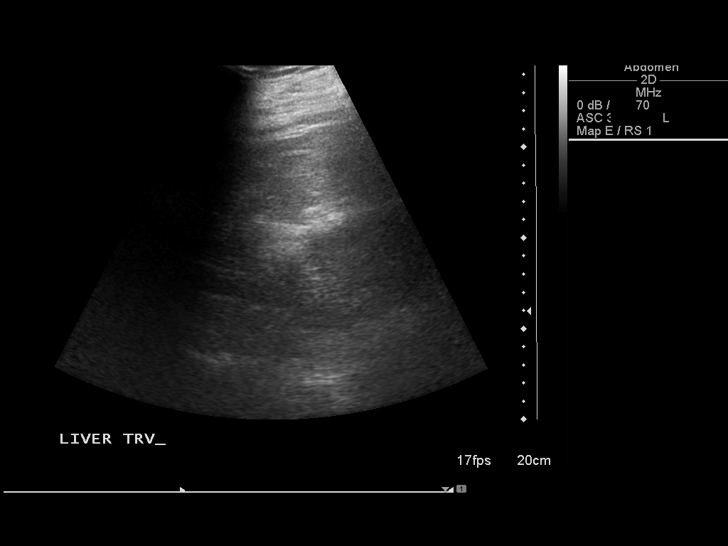
[im 28/74]
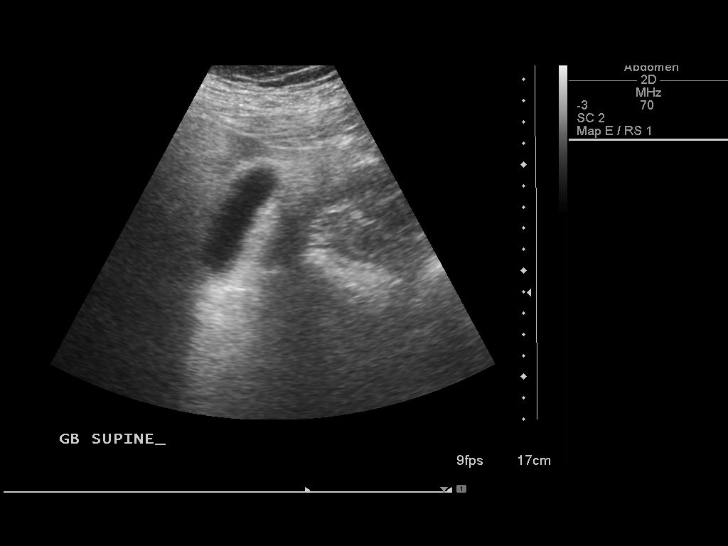
[im 34/74]
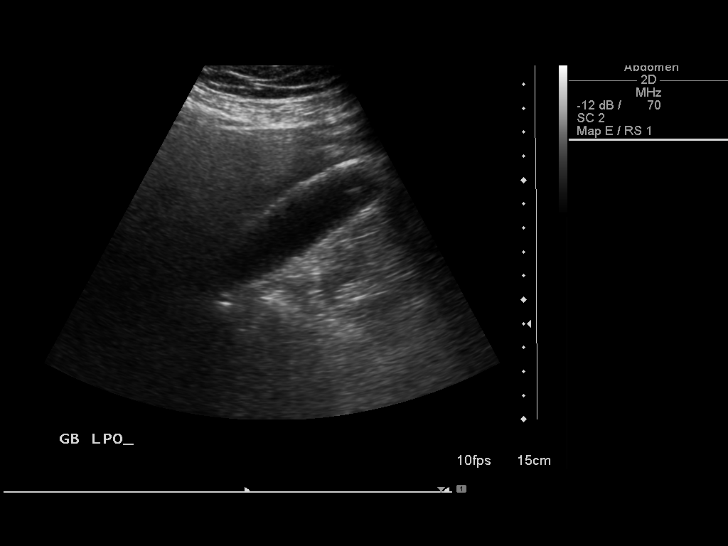
[im 40/74]
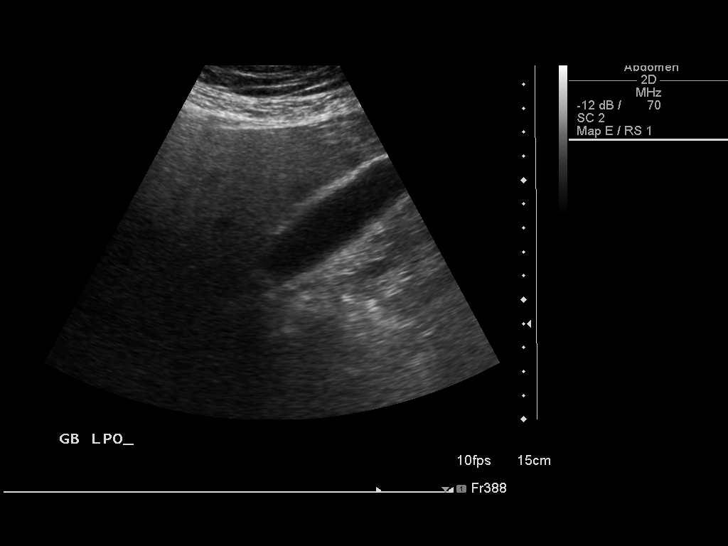
[im 46/74]
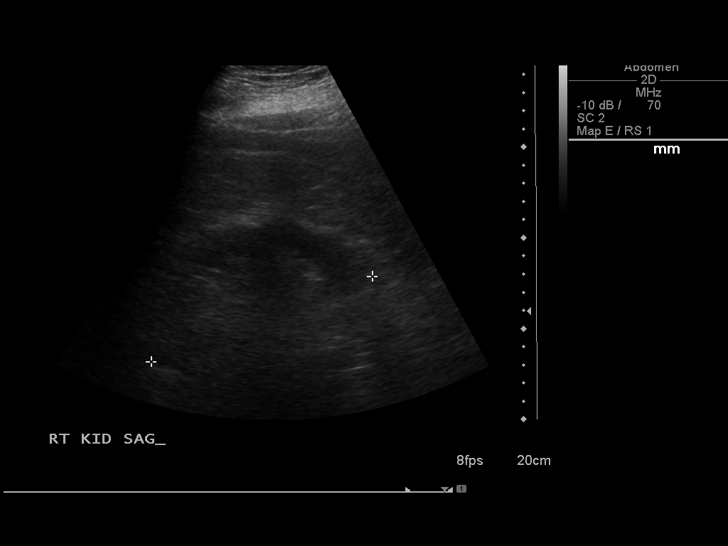
[im 49/74]
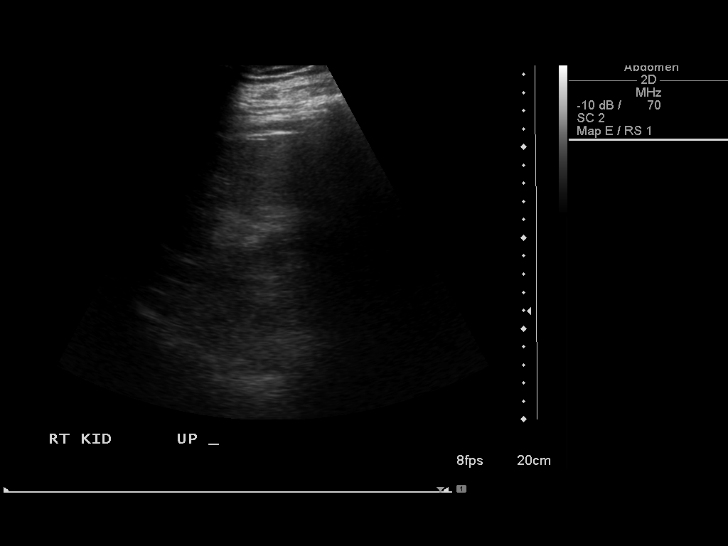
[im 55/74]
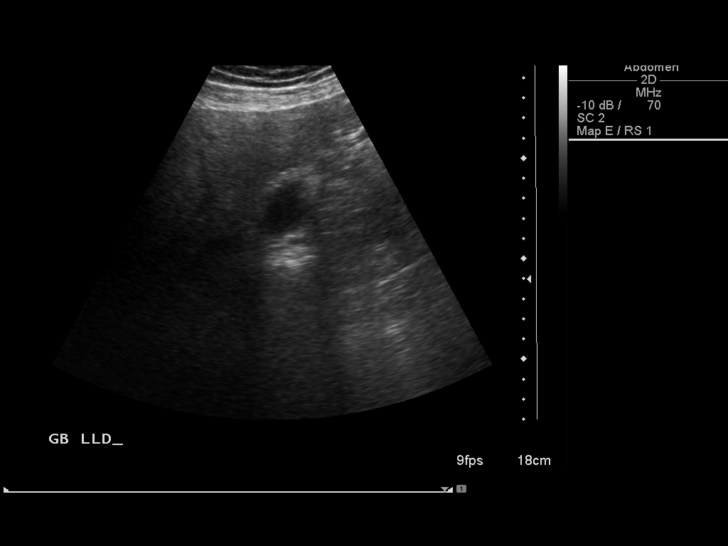
[im 61/74]
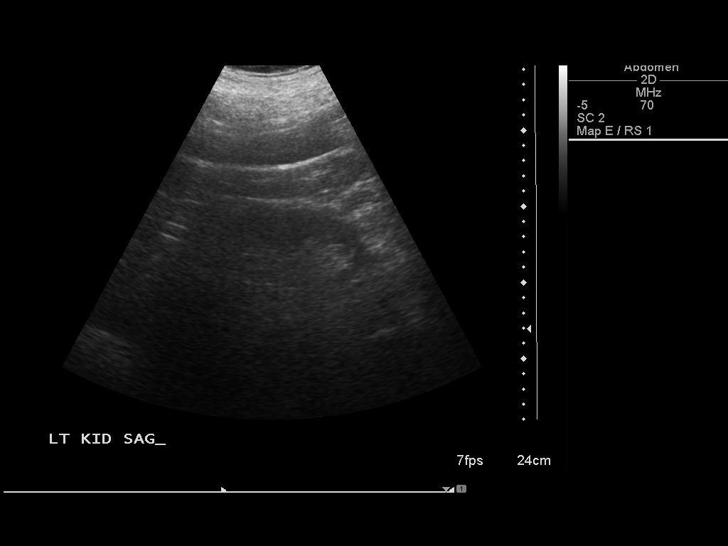
[im 67/74]
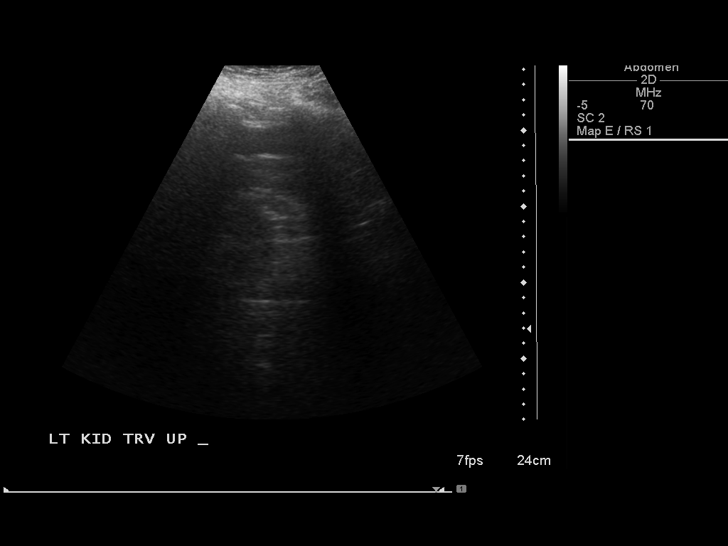
[im 74/74]
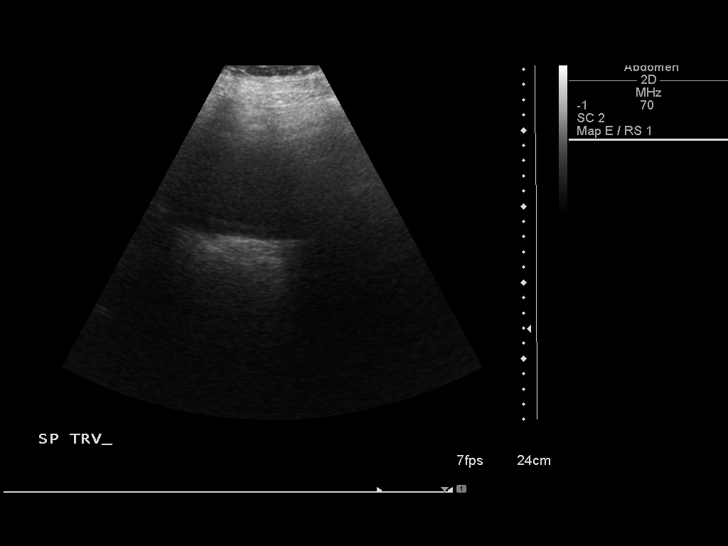

[14 of 25 positions shown; findings below may reference images not displayed]

FINDINGS: Gallbladder:  Cholelithiasis in the gallbladder neck.  No
associated gallbladder wall thickening, pericholecystic fluid, or
sonographic Murphy's sign.

Common bile duct:  Measures 5 mm.

Liver:  Coarse, hyperechoic hepatic parenchyma, compatible with
hepatic steatosis.  No focal hepatic lesions.

IVC:  Not visualized.

Pancreas:  Poorly visualized due to overlying bowel gas/portable
technique.

Spleen:  Measures 11.2 cm.

Right Kidney:  Measures 13.1 cm.  No mass or hydronephrosis.

Left Kidney:  Measures 14.3 cm.  No mass or hydronephrosis.

Abdominal aorta:  Not visualized.
IMPRESSION: Cholelithiasis, without associated findings to suggest acute
cholecystitis.  Negative sonographic Murphy's sign.

Hepatic steatosis.

## 2013-06-05 ENCOUNTER — Encounter (HOSPITAL_COMMUNITY): Payer: Self-pay | Admitting: Emergency Medicine

## 2013-06-05 ENCOUNTER — Inpatient Hospital Stay (HOSPITAL_COMMUNITY)
Admission: EM | Admit: 2013-06-05 | Discharge: 2013-06-08 | DRG: 439 | Disposition: A | Discharge status: Home / Self Care | Payer: Medicare Other | Attending: Family Medicine | Admitting: Family Medicine

## 2013-06-05 ENCOUNTER — Emergency Department (HOSPITAL_COMMUNITY): Payer: Medicare Other

## 2013-06-05 DIAGNOSIS — Z87891 Personal history of nicotine dependence: Secondary | ICD-10-CM

## 2013-06-05 DIAGNOSIS — I10 Essential (primary) hypertension: Secondary | ICD-10-CM | POA: Diagnosis present

## 2013-06-05 DIAGNOSIS — E7800 Pure hypercholesterolemia, unspecified: Secondary | ICD-10-CM | POA: Diagnosis present

## 2013-06-05 DIAGNOSIS — K859 Acute pancreatitis without necrosis or infection, unspecified: Principal | ICD-10-CM | POA: Diagnosis present

## 2013-06-05 DIAGNOSIS — N390 Urinary tract infection, site not specified: Secondary | ICD-10-CM | POA: Diagnosis present

## 2013-06-05 DIAGNOSIS — Z6839 Body mass index (BMI) 39.0-39.9, adult: Secondary | ICD-10-CM

## 2013-06-05 DIAGNOSIS — Z66 Do not resuscitate: Secondary | ICD-10-CM | POA: Diagnosis not present

## 2013-06-05 DIAGNOSIS — E119 Type 2 diabetes mellitus without complications: Secondary | ICD-10-CM | POA: Diagnosis present

## 2013-06-05 DIAGNOSIS — J4489 Other specified chronic obstructive pulmonary disease: Secondary | ICD-10-CM | POA: Diagnosis present

## 2013-06-05 DIAGNOSIS — J449 Chronic obstructive pulmonary disease, unspecified: Secondary | ICD-10-CM

## 2013-06-05 DIAGNOSIS — N179 Acute kidney failure, unspecified: Secondary | ICD-10-CM | POA: Diagnosis present

## 2013-06-05 DIAGNOSIS — G4733 Obstructive sleep apnea (adult) (pediatric): Secondary | ICD-10-CM | POA: Diagnosis present

## 2013-06-05 HISTORY — DX: Pure hypercholesterolemia, unspecified: E78.00

## 2013-06-05 HISTORY — DX: Essential (primary) hypertension: I10

## 2013-06-05 HISTORY — DX: Acute pancreatitis without necrosis or infection, unspecified: K85.90

## 2013-06-05 HISTORY — DX: Obstructive sleep apnea (adult) (pediatric): G47.33

## 2013-06-05 HISTORY — DX: Shortness of breath: R06.02

## 2013-06-05 HISTORY — DX: Dependence on other enabling machines and devices: Z99.89

## 2013-06-05 LAB — HEMOGLOBIN A1C: Mean Plasma Glucose: 143 mg/dL — ABNORMAL HIGH (ref <117)

## 2013-06-05 LAB — PROTIME-INR
INR: 0.97 (ref 0.00–1.49)
Prothrombin Time: 12.7 seconds (ref 11.6–15.2)

## 2013-06-05 LAB — COMPREHENSIVE METABOLIC PANEL
Albumin: 3.7 g/dL (ref 3.5–5.2)
Alkaline Phosphatase: 224 U/L — ABNORMAL HIGH (ref 39–117)
BUN: 19 mg/dL (ref 6–23)
Calcium: 9.2 mg/dL (ref 8.4–10.5)
Creatinine, Ser: 1.31 mg/dL (ref 0.50–1.35)
GFR calc Af Amer: 63 mL/min — ABNORMAL LOW (ref >90)
Glucose, Bld: 259 mg/dL — ABNORMAL HIGH (ref 70–99)
Potassium: 4.3 mEq/L (ref 3.5–5.1)
Total Protein: 7 g/dL (ref 6.0–8.3)

## 2013-06-05 LAB — CBC WITH DIFFERENTIAL/PLATELET
Basophils Relative: 0 % (ref 0–1)
Eosinophils Absolute: 0 10*3/uL (ref 0.0–0.7)
Eosinophils Relative: 0 % (ref 0–5)
Hemoglobin: 15.3 g/dL (ref 13.0–17.0)
Lymphs Abs: 0.7 10*3/uL (ref 0.7–4.0)
MCH: 28.8 pg (ref 26.0–34.0)
MCHC: 33.3 g/dL (ref 30.0–36.0)
MCV: 86.6 fL (ref 78.0–100.0)
Monocytes Absolute: 1.3 10*3/uL — ABNORMAL HIGH (ref 0.1–1.0)
Monocytes Relative: 6 % (ref 3–12)
Neutrophils Relative %: 91 % — ABNORMAL HIGH (ref 43–77)
RBC: 5.31 MIL/uL (ref 4.22–5.81)

## 2013-06-05 LAB — GLUCOSE, CAPILLARY
Glucose-Capillary: 226 mg/dL — ABNORMAL HIGH (ref 70–99)
Glucose-Capillary: 198 mg/dL — ABNORMAL HIGH (ref 70–99)
Glucose-Capillary: 150 mg/dL — ABNORMAL HIGH (ref 70–99)

## 2013-06-05 LAB — LIPASE, BLOOD: Lipase: >3000 U/L — ABNORMAL HIGH (ref 11–59)

## 2013-06-05 LAB — CG4 I-STAT (LACTIC ACID): Lactic Acid, Venous: 2.66 mmol/L — ABNORMAL HIGH (ref 0.5–2.2)

## 2013-06-05 LAB — TROPONIN I
Troponin I: <0.3 ng/mL (ref <0.30)
Troponin I: <0.3 ng/mL (ref <0.30)

## 2013-06-05 MED ORDER — ACETAMINOPHEN 325 MG PO TABS: 650.0000 mg | ORAL_TABLET | Freq: Four times a day (QID) | ORAL | Status: DC | PRN

## 2013-06-05 MED ORDER — SIMVASTATIN 20 MG PO TABS
20.0000 mg | ORAL_TABLET | Freq: Every day | ORAL | Status: DC
  Administered 2013-06-05 – 2013-06-07 (×3): 20 mg via ORAL
  Filled 2013-06-05 (×4): qty 1

## 2013-06-05 MED ORDER — ACETAMINOPHEN 650 MG RE SUPP: 650.0000 mg | Freq: Four times a day (QID) | RECTAL | Status: DC | PRN

## 2013-06-05 MED ORDER — IOHEXOL 300 MG/ML  SOLN
100.0000 mL | Freq: Once | INTRAMUSCULAR | Status: AC | PRN
  Administered 2013-06-05: 100 mL via INTRAVENOUS

## 2013-06-05 MED ORDER — PANTOPRAZOLE SODIUM 40 MG IV SOLR
40.0000 mg | Freq: Every day | INTRAVENOUS | Status: DC
  Administered 2013-06-05 – 2013-06-07 (×3): 40 mg via INTRAVENOUS
  Filled 2013-06-05 (×4): qty 40

## 2013-06-05 MED ORDER — FENTANYL CITRATE 0.05 MG/ML IJ SOLN
100.0000 ug | Freq: Once | INTRAMUSCULAR | Status: AC
  Administered 2013-06-05: 100 ug via INTRAVENOUS
  Filled 2013-06-05: qty 2

## 2013-06-05 MED ORDER — INSULIN ASPART 100 UNIT/ML ~~LOC~~ SOLN
0.0000 [IU] | SUBCUTANEOUS | Status: DC
Start: 2013-06-05 — End: 2013-06-08
  Administered 2013-06-05 (×2): 1 [IU] via SUBCUTANEOUS
  Administered 2013-06-05: 3 [IU] via SUBCUTANEOUS
  Administered 2013-06-05 – 2013-06-06 (×2): 2 [IU] via SUBCUTANEOUS
  Administered 2013-06-06: 1 [IU] via SUBCUTANEOUS
  Administered 2013-06-06: 2 [IU] via SUBCUTANEOUS
  Administered 2013-06-06: 1 [IU] via SUBCUTANEOUS

## 2013-06-05 MED ORDER — ALBUTEROL SULFATE (5 MG/ML) 0.5% IN NEBU: 2.5000 mg | INHALATION_SOLUTION | RESPIRATORY_TRACT | Status: DC | PRN

## 2013-06-05 MED ORDER — SODIUM CHLORIDE 0.9 % IV BOLUS (SEPSIS)
1000.0000 mL | Freq: Once | INTRAVENOUS | Status: AC
  Administered 2013-06-05: 1000 mL via INTRAVENOUS

## 2013-06-05 MED ORDER — ONDANSETRON HCL 4 MG PO TABS: 4.0000 mg | ORAL_TABLET | Freq: Four times a day (QID) | ORAL | Status: DC | PRN

## 2013-06-05 MED ORDER — IPRATROPIUM BROMIDE 0.02 % IN SOLN: 0.5000 mg | Freq: Four times a day (QID) | RESPIRATORY_TRACT | Status: DC | PRN

## 2013-06-05 MED ORDER — HYDROCODONE-ACETAMINOPHEN 5-325 MG PO TABS
1.0000 | ORAL_TABLET | ORAL | Status: DC | PRN
  Administered 2013-06-06: 2 via ORAL
  Filled 2013-06-05: qty 2

## 2013-06-05 MED ORDER — HYDROMORPHONE HCL PF 1 MG/ML IJ SOLN
2.0000 mg | INTRAMUSCULAR | Status: DC | PRN
  Administered 2013-06-05 (×2): 2 mg via INTRAVENOUS
  Filled 2013-06-05 (×2): qty 2

## 2013-06-05 MED ORDER — HYDROMORPHONE HCL PF 1 MG/ML IJ SOLN
1.0000 mg | Freq: Once | INTRAMUSCULAR | Status: AC
  Administered 2013-06-05: 1 mg via INTRAVENOUS
  Filled 2013-06-05: qty 1

## 2013-06-05 MED ORDER — ONDANSETRON HCL 4 MG/2ML IJ SOLN
4.0000 mg | Freq: Four times a day (QID) | INTRAMUSCULAR | Status: DC | PRN
  Administered 2013-06-05: 4 mg via INTRAVENOUS
  Filled 2013-06-05: qty 2

## 2013-06-05 MED ORDER — HYDROMORPHONE HCL PF 1 MG/ML IJ SOLN
1.0000 mg | INTRAMUSCULAR | Status: DC | PRN
  Administered 2013-06-05 (×2): 1 mg via INTRAVENOUS
  Filled 2013-06-05 (×2): qty 1

## 2013-06-05 MED ORDER — HYDRALAZINE HCL 20 MG/ML IJ SOLN: 10.0000 mg | INTRAMUSCULAR | Status: DC | PRN

## 2013-06-05 MED ORDER — SODIUM CHLORIDE 0.9 % IV SOLN
INTRAVENOUS | Status: AC
  Administered 2013-06-05 (×2): via INTRAVENOUS

## 2013-06-05 MED ORDER — SODIUM CHLORIDE 0.9 % IJ SOLN
3.0000 mL | Freq: Two times a day (BID) | INTRAMUSCULAR | Status: DC
  Administered 2013-06-05 – 2013-06-06 (×2): 3 mL via INTRAVENOUS

## 2013-06-05 MED ORDER — MORPHINE SULFATE 2 MG/ML IJ SOLN
2.0000 mg | INTRAMUSCULAR | Status: DC | PRN
  Administered 2013-06-05 – 2013-06-06 (×4): 2 mg via INTRAVENOUS
  Filled 2013-06-05 (×4): qty 1

## 2013-06-05 MED ORDER — FENTANYL CITRATE 0.05 MG/ML IJ SOLN
50.0000 ug | Freq: Once | INTRAMUSCULAR | Status: AC
  Administered 2013-06-05: 50 ug via INTRAVENOUS
  Filled 2013-06-05: qty 2

## 2013-06-05 MED ORDER — ONDANSETRON HCL 4 MG/2ML IJ SOLN
4.0000 mg | Freq: Once | INTRAMUSCULAR | Status: AC
  Administered 2013-06-05: 4 mg via INTRAVENOUS
  Filled 2013-06-05: qty 2

## 2013-06-05 NOTE — ED Notes (Signed)
Pt began having epigastric/CP at 4pm this afternoon. Pt reports pain is 10/10. Pt has had 2 episodes of emesis and nausea with symptoms. CBG 172. PMH: Cholecystectomy, HTN, DM, Obstructive Sleep Apnea, COPD.

## 2013-06-05 NOTE — ED Notes (Signed)
Admitting at bedside 

## 2013-06-05 NOTE — Progress Notes (Signed)
Pt very nauseated/dizzy/feeling loopy with 2mg  Dilaudid, and 1mg  Dilaudid was not helping pain. Notified MD of this, and MD ordered 2mg  Morphine q2hrs. Will continue to monitor.

## 2013-06-05 NOTE — Progress Notes (Signed)
Utilization Review Completed.Clinton Young T10/02/2013

## 2013-06-05 NOTE — Progress Notes (Signed)
Patient admitted early this AM by Dr. Adela Glimpse. Please see H&P.   severe pancreatitis" -IVF -pain control -NPO GB out in 2012  Marlin Canary DO

## 2013-06-05 NOTE — ED Provider Notes (Signed)
CSN: [id number]     Arrival date & time 06/05/13  0025 History   First MD Initiated Contact with Patient 06/05/13 0047     Chief Complaint  Patient presents with  . Abdominal Pain  . Chest Pain   (Consider location/radiation/quality/duration/timing/severity/associated sxs/prior Treatment) HPI Clinton Young presents with gradual onset of periumbilical pain that has worsened since 4 PM yesterday afternoon. He's had 2 episodes of vomiting and continues to be nauseated. He states he had a bowel movement which was watery earlier in the day. He has passed minimal gas. No definite fevers or chills but he does admit diaphoresis. He's had a previous history of cholecystectomy and hernia repair. No blood in the vomit or stool. He has no pain in his chest or shortness of breath. Patient states the pain is much worse with any movement or palpation of the abdomen. States that the bumpy ride in the ambulance was very painful. Past Medical History  Diagnosis Date  . COPD (chronic obstructive pulmonary disease)   . Diabetes mellitus    Past Surgical History  Procedure Laterality Date  . Cholecystectomy     No family history on file. History  Substance Use Topics  . Smoking status: Never Smoker   . Smokeless tobacco: Never Used  . Alcohol Use: No    Review of Systems  Constitutional: Positive for diaphoresis. Negative for fever and chills.  Respiratory: Negative for cough and shortness of breath.   Cardiovascular: Negative for chest pain.  Gastrointestinal: Positive for nausea, vomiting, abdominal pain and diarrhea. Negative for constipation and blood in stool.  Genitourinary: Negative for dysuria.  Musculoskeletal: Negative for back pain.  Skin: Negative for rash and wound.  Neurological: Negative for dizziness, weakness, light-headedness, numbness and headaches.  All other systems reviewed and are negative.    Allergies  Review of patient's allergies indicates no known allergies.  Home  Medications   Current Outpatient Rx  Name  Route  Sig  Dispense  Refill  . allopurinol (ZYLOPRIM) 100 MG tablet   Oral   Take 200 mg by mouth daily.          Marland Kitchen aspirin 81 MG tablet   Oral   Take 81 mg by mouth daily.           Marland Kitchen buPROPion (ZYBAN) 150 MG 12 hr tablet   Oral   Take 150 mg by mouth 2 (two) times daily.           . Cholecalciferol (VITAMIN D3) 1000 UNITS CAPS   Oral   Take 2,000 Units by mouth daily.          . flunisolide (NASALIDE) 0.025 % SOLN   Inhalation   Inhale 2 sprays into the lungs 2 (two) times daily.           Marland Kitchen glipiZIDE (GLUCOTROL) 10 MG tablet   Oral   Take 10 mg by mouth 2 (two) times daily before a meal.         . IPRATROPIUM BROMIDE IN   Inhalation   Inhale 17 mcg into the lungs.           Boris Lown Oil 300 MG CAPS   Oral   Take 300 mg by mouth 2 (two) times daily.         Marland Kitchen lisinopril (PRINIVIL,ZESTRIL) 10 MG tablet   Oral   Take 5 mg by mouth daily.         . metFORMIN (GLUCOPHAGE) 850 MG tablet   Oral  Take 850 mg by mouth 2 (two) times daily with a meal.         . potassium chloride (K-DUR,KLOR-CON) 10 MEQ tablet   Oral   Take 10 mEq by mouth daily.         . pravastatin (PRAVACHOL) 40 MG tablet   Oral   Take 40 mg by mouth daily. 1/2 tablet           BP 146/68  Pulse 89  Temp(Src) 97.6 F (36.4 C) (Oral)  Ht 5\' 11"  (1.803 m)  Wt 284 lb (128.822 kg)  BMI 39.63 kg/m2  SpO2 93% Physical Exam  Nursing note and vitals reviewed. Constitutional: He is oriented to person, place, and time. He appears well-developed and well-nourished. He appears distressed.  HENT:  Head: Normocephalic and atraumatic.  Mouth/Throat: Oropharynx is clear and moist. No oropharyngeal exudate.  Eyes: EOM are normal. Pupils are equal, round, and reactive to light.  Neck: Normal range of motion. Neck supple.  Cardiovascular: Normal rate and regular rhythm.   Pulmonary/Chest: Effort normal and breath sounds normal. No  respiratory distress. He has no wheezes. He has no rales. He exhibits no tenderness.  Abdominal: He exhibits distension. There is tenderness. There is rebound and guarding.  Patient with a large abdomen. It is distended and diffusely tender. He has guarding throughout with rebound tenderness. His high-pitched tinkling bowel sounds throughout.  Musculoskeletal: Normal range of motion. He exhibits no edema and no tenderness.  Neurological: He is alert and oriented to person, place, and time.  Moves all extremities without deficit. Sensation grossly intact.  Skin: Skin is warm. No rash noted. He is diaphoretic. No erythema.  Psychiatric: He has a normal mood and affect. His behavior is normal.    ED Course  Procedures (including critical care time) Labs Review Labs Reviewed  CBC WITH DIFFERENTIAL  COMPREHENSIVE METABOLIC PANEL  LIPASE, BLOOD  TROPONIN I  PROTIME-INR  URINALYSIS, ROUTINE W REFLEX MICROSCOPIC  APTT   Imaging Review No results found.  Date: 06/05/2013  Rate:87  Rhythm: normal sinus rhythm  QRS Axis: normal  Intervals: normal  ST/T Wave abnormalities: normal  Conduction Disutrbances:none  Narrative Interpretation:   Old EKG Reviewed: none available   CRITICAL CARE Performed by: Ranae Palms, Teran Daughenbaugh Total critical care time: 30 min Critical care time was exclusive of separately billable procedures and treating other patients. Critical care was necessary to treat or prevent imminent or life-threatening deterioration. Critical care was time spent personally by me on the following activities: development of treatment plan with patient and/or surrogate as well as nursing, discussions with consultants, evaluation of patient's response to treatment, examination of patient, obtaining history from patient or surrogate, ordering and performing treatments and interventions, ordering and review of laboratory studies, ordering and review of radiographic studies, pulse oximetry and  re-evaluation of patient's condition.   MDM  Suspect surgical abdomen. Will get a chest x-ray to assess for free air. IV line has been started we'll give narcotic medication for pain control. Will involve surgery early in the course.  Chest x-ray without evidence of free air. He does have some bibasilar opacities but I suspect this is more atelectasis. He has no respiratory symptoms.  Discussed with the OR nurse who relayed patient's presentation and exam to Dr. Donell Beers. She is aware he is an emergency department pending a completed workup.  The patient's CT reveals pancreatitis. Elevated lactic acid and white blood cell count as well as his liver function tests. Dr. Donell Beers will  see in consult tomorrow but suggests patient needs to be admitted by medicine and a GI consult.   Patient states he is feeling much better after the pain medication. He is lying comfortably in bed. Discussed with Dr. Adela Glimpse and she will see the patient in the emergency department and admit.  Loren Racer, MD 06/05/13 [phone]

## 2013-06-05 NOTE — ED Notes (Signed)
Patient returned from CT

## 2013-06-05 NOTE — ED Notes (Signed)
Patient transported to CT 

## 2013-06-05 NOTE — ED Notes (Signed)
CT paged. 

## 2013-06-05 NOTE — H&P (Signed)
PCP:  [facility], [place]    Chief Complaint:  Abdominal pain  HPI: Clinton Young is a 67 y.o. male   has a past medical history of COPD (chronic obstructive pulmonary disease); Diabetes mellitus; Obstructive sleep apnea on CPAP; and High cholesterol.   Presented with  Since yesterday he started to have pain and after he ate the pain has worsened. He have had some nausea and vomiting. He have had some chills but no fever. He presented to ER and had abdominal CT scan done that showed  Pancreatitis. Hospitalist was called for an admission. A consult has been also placed to surgery but they felt it was more of a medical management.  Patient denies alcohol abuse.  Review of Systems:    Pertinent positives include:chills, abdominal pain, nausea, vomiting,   Constitutional:  No weight loss, night sweats, Fevers,  fatigue, weight loss  HEENT:  No headaches, Difficulty swallowing,Tooth/dental problems,Sore throat,  No sneezing, itching, ear ache, nasal congestion, post nasal drip,  Cardio-vascular:  No chest pain, Orthopnea, PND, anasarca, dizziness, palpitations.no Bilateral lower extremity swelling  GI:  No heartburn, indigestion, diarrhea, change in bowel habits, loss of appetite, melena, blood in stool, hematemesis Resp:  no shortness of breath at rest. No dyspnea on exertion, No excess mucus, no productive cough, No non-productive cough, No coughing up of blood.No change in color of mucus.No wheezing. Skin:  no rash or lesions. No jaundice GU:  no dysuria, change in color of urine, no urgency or frequency. No straining to urinate.  No flank pain.  Musculoskeletal:  No joint pain or no joint swelling. No decreased range of motion. No back pain.  Psych:  No change in mood or affect. No depression or anxiety. No memory loss.  Neuro: no localizing neurological complaints, no tingling, no weakness, no double vision, no gait abnormality, no slurred speech, no  confusion  Otherwise ROS are negative except for above, 10 systems were reviewed  Past Medical History: Past Medical History  Diagnosis Date  . COPD (chronic obstructive pulmonary disease)   . Diabetes mellitus   . Obstructive sleep apnea on CPAP   . High cholesterol    Past Surgical History  Procedure Laterality Date  . Cholecystectomy       Medications: Prior to Admission medications   Medication Sig Start Date End Date Taking? Authorizing Provider  allopurinol (ZYLOPRIM) 100 MG tablet Take 200 mg by mouth daily.    Yes Historical Provider, MD  aspirin 81 MG tablet Take 81 mg by mouth daily.     Yes Historical Provider, MD  buPROPion (ZYBAN) 150 MG 12 hr tablet Take 150 mg by mouth 2 (two) times daily.     Yes Historical Provider, MD  Cholecalciferol (VITAMIN D3) 1000 UNITS CAPS Take 2,000 Units by mouth daily.    Yes Historical Provider, MD  flunisolide (NASALIDE) 0.025 % SOLN Inhale 2 sprays into the lungs 2 (two) times daily.     Yes Historical Provider, MD  glipiZIDE (GLUCOTROL) 10 MG tablet Take 10 mg by mouth 2 (two) times daily before a meal.   Yes Historical Provider, MD  IPRATROPIUM BROMIDE IN Inhale 17 mcg into the lungs.     Yes Historical Provider, MD  Boris Lown Oil 300 MG CAPS Take 300 mg by mouth 2 (two) times daily.   Yes Historical Provider, MD  lisinopril (PRINIVIL,ZESTRIL) 10 MG tablet Take 5 mg by mouth daily.   Yes Historical Provider, MD  metFORMIN (GLUCOPHAGE) 850 MG  tablet Take 850 mg by mouth 2 (two) times daily with a meal.   Yes Historical Provider, MD  potassium chloride (K-DUR,KLOR-CON) 10 MEQ tablet Take 10 mEq by mouth daily.   Yes Historical Provider, MD  pravastatin (PRAVACHOL) 40 MG tablet Take 40 mg by mouth daily. 1/2 tablet    Yes Historical Provider, MD    Allergies:  No Known Allergies  Social History:  Ambulatory  independently   Lives at home   reports that he has quit smoking. He has never used smokeless tobacco. He reports that he  does not drink alcohol or use illicit drugs.   Family History: family history includes Cancer in his sister.    Physical Exam: Patient Vitals for the past 24 hrs:  BP Temp Temp src Pulse Resp SpO2 Height Weight  06/05/13 0330 164/76 mmHg - - 101 13 91 % - -  06/05/13 0204 181/84 mmHg - - 97 24 94 % - -  06/05/13 0151 162/63 mmHg - - 89 19 93 % - -  06/05/13 0033 146/68 mmHg 97.6 F (36.4 C) Oral 89 - 93 % 5\' 11"  (1.803 m) 128.822 kg (284 lb)    1. General:  in No Acute distress 2. Psychological: Alert and   Oriented 3. Head/ENT:   Moist  Mucous Membranes                          Head Non traumatic, neck supple                          Normal   Dentition 4. SKIN:   decreased Skin turgor,  Skin clean Dry and intact no rash 5. Heart: Regular rate and rhythm no Murmur, Rub or gallop 6. Lungs:  wheezes bilaterally or crackles   7. Abdomen: Soft, severely tender,  distended 8. Lower extremities: no clubbing, cyanosis, or edema 9. Neurologically Grossly intact, moving all 4 extremities equally 10. MSK: Normal range of motion  body mass index is 39.63 kg/(m^2).   Labs on Admission:   Recent Labs  06/05/13 0138 06/05/13 0142  NA 138  --   K 4.3  --   CL 101  --   CO2 23  --   GLUCOSE 259*  --   BUN 19  --   CREATININE 1.31 1.60*  CALCIUM 9.2  --     Recent Labs  06/05/13 0138  AST 389*  ALT 323*  ALKPHOS 224*  BILITOT 1.8*  PROT 7.0  ALBUMIN 3.7    Recent Labs  06/05/13 0138  LIPASE >3000*    Recent Labs  06/05/13 0138  WBC 21.5*  NEUTROABS 19.4*  HGB 15.3  HCT 46.0  MCV 86.6  PLT 288    Recent Labs  06/05/13 0138  TROPONINI <0.30   No results found for this basename: TSH, T4TOTAL, FREET3, T3FREE, THYROIDAB,  in the last 72 hours No results found for this basename: VITAMINB12, FOLATE, FERRITIN, TIBC, IRON, RETICCTPCT,  in the last 72 hours Lab Results  Component Value Date   HGBA1C 7.2* 06/22/2011    Estimated Creatinine Clearance: 60.4  ml/min (by C-G formula based on Cr of 1.6). ABG No results found for this basename: phart, pco2, po2, hco3, tco2, acidbasedef, o2sat     No results found for this basename: DDIMER     Other results:  I have pearsonaly reviewed this: ECG REPORT  Rate: 87  Rhythm: NSR  ST&T Change: no ischemic changes  Lipase >3000  Cultures: No results found for this basename: sdes, specrequest, cult, reptstatus       Radiological Exams on Admission: Ct Abdomen Pelvis W Contrast  06/05/2013   *RADIOLOGY REPORT*  Clinical Data: Epigastric abdominal pain and chest pain.  CT ABDOMEN AND PELVIS WITH CONTRAST  Technique:  Multidetector CT imaging of the abdomen and pelvis was performed following the standard protocol during bolus administration of intravenous contrast.  Contrast: OMNIPAQUE IOHEXOL 300 MG/ML  SOLN  Comparison: CT of the abdomen and pelvis, and renal ultrasound performed 06/21/2011  Findings: Bibasilar atelectasis or scarring is noted.  Soft tissue inflammation is noted surrounding the entirety of the pancreas, with associated free fluid tracking about the pancreas and duodenum, extending inferiorly along the left mid abdomen, anterior to Gerota's fascia.  No free fluid is seen in the pelvis. There is no evidence of devascularization or pseudocyst formation.  The liver and spleen are unremarkable in appearance.  The patient is status post cholecystectomy, with clips along the gallbladder fossa.  The adrenal glands are unremarkable in appearance.  Nonspecific perinephric stranding is noted bilaterally.  The kidneys are otherwise unremarkable in appearance.  There is no evidence of hydronephrosis.  No renal or ureteral stones are seen.  The more distal small bowel is unremarkable in appearance.  The stomach is within normal limits.  No acute vascular abnormalities are seen.  The appendix is normal in caliber, without evidence for appendicitis.  Diffuse diverticulosis is noted along the  descending and sigmoid colon, without evidence of diverticulitis.  The colon is otherwise unremarkable in appearance.  The bladder is mildly distended and grossly unremarkable in appearance.  The prostate remains normal in size.  No inguinal lymphadenopathy is seen.  No acute osseous abnormalities are identified.  Multilevel vacuum phenomenon and disc space narrowing are noted along the lumbar spine, with associated endplate sclerotic change and underlying facet disease.  IMPRESSION:  1.  Acute pancreatitis noted, with soft tissue inflammation about the entirety of the pancreas, and free fluid tracking inferiorly along the left mid abdomen.  No free fluid seen within the pelvis. No evidence of devascularization or pseudocyst formation at this time. 2.  Bibasilar atelectasis or scarring noted. 3.  Diffuse diverticulosis along the descending and sigmoid colon, without evidence of diverticulitis. 4.  Mild degenerative change along the lumbar spine.   Original Report Authenticated By: Tonia Ghent, M.D.   Dg Chest Port 1 View  06/05/2013   *RADIOLOGY REPORT*  Clinical Data: Shortness of breath.  Assess for free intra- abdominal air.  PORTABLE CHEST - 1 VIEW  Comparison: Chest radiograph performed 06/21/2011  Findings: The lungs are relatively well expanded.  Patchy bibasilar airspace opacities raise concern for mild multifocal pneumonia, given the patient's symptoms.  No pleural effusion or pneumothorax is seen.  The cardiomediastinal silhouette remains normal in size.  No acute osseous abnormalities are identified.  IMPRESSION: Patchy bibasilar airspace opacities raise concern for mild multifocal pneumonia, given the patient's symptoms.   Original Report Authenticated By: Tonia Ghent, M.D.    Chart has been reviewed  Assessment/Plan  67 yo M w hx sp cholecystectomy here with severe pancreatitis.  Present on Admission:  . Pancreatitis - IVF, NPO, Gi consult if no improvement, pain management, Will check  lipid levels, stop potentially offending medications.  . Diabetes mellitus - SSI, hold metfromin . COPD (chronic obstructive pulmonary disease) - Atrovent and albuterol PRN . Hypertension - hydralazine PRN, will  restart PO meds once able to tolerate   Prophylaxis: SCD  Protonix  CODE STATUS: DNR/DNI  Other plan as per orders.  I have spent a total of 55 min on this admission  Cirilo Canner 06/05/2013, 4:45 AM

## 2013-06-06 ENCOUNTER — Inpatient Hospital Stay (HOSPITAL_COMMUNITY): Payer: Medicare Other

## 2013-06-06 LAB — URINE MICROSCOPIC-ADD ON

## 2013-06-06 LAB — CBC
HCT: 42.7 % (ref 39.0–52.0)
Hemoglobin: 13.6 g/dL (ref 13.0–17.0)
MCV: 89.1 fL (ref 78.0–100.0)
RBC: 4.79 MIL/uL (ref 4.22–5.81)
WBC: 14.7 10*3/uL — ABNORMAL HIGH (ref 4.0–10.5)

## 2013-06-06 LAB — COMPREHENSIVE METABOLIC PANEL
Albumin: 3.3 g/dL — ABNORMAL LOW (ref 3.5–5.2)
BUN: 21 mg/dL (ref 6–23)
Calcium: 9 mg/dL (ref 8.4–10.5)
Chloride: 102 mEq/L (ref 96–112)
Creatinine, Ser: 1.36 mg/dL — ABNORMAL HIGH (ref 0.50–1.35)
GFR calc Af Amer: 60 mL/min — ABNORMAL LOW (ref >90)
Glucose, Bld: 123 mg/dL — ABNORMAL HIGH (ref 70–99)
Potassium: 4.1 mEq/L (ref 3.5–5.1)
Sodium: 140 mEq/L (ref 135–145)
Total Bilirubin: 2.1 mg/dL — ABNORMAL HIGH (ref 0.3–1.2)
Total Protein: 6.7 g/dL (ref 6.0–8.3)

## 2013-06-06 LAB — LIPID PANEL
Cholesterol: 101 mg/dL (ref 0–200)
HDL: 46 mg/dL (ref >39)
Total CHOL/HDL Ratio: 2.2 RATIO
Triglycerides: 72 mg/dL (ref <150)
VLDL: 14 mg/dL (ref 0–40)

## 2013-06-06 LAB — URINALYSIS, ROUTINE W REFLEX MICROSCOPIC
Ketones, ur: NEGATIVE mg/dL
Nitrite: POSITIVE — AB
Protein, ur: 30 mg/dL — AB
pH: 5.5 (ref 5.0–8.0)

## 2013-06-06 LAB — HEMOGLOBIN A1C: Mean Plasma Glucose: 140 mg/dL — ABNORMAL HIGH (ref <117)

## 2013-06-06 LAB — GLUCOSE, CAPILLARY
Glucose-Capillary: 152 mg/dL — ABNORMAL HIGH (ref 70–99)
Glucose-Capillary: 141 mg/dL — ABNORMAL HIGH (ref 70–99)
Glucose-Capillary: 137 mg/dL — ABNORMAL HIGH (ref 70–99)
Glucose-Capillary: 105 mg/dL — ABNORMAL HIGH (ref 70–99)
Glucose-Capillary: 157 mg/dL — ABNORMAL HIGH (ref 70–99)

## 2013-06-06 LAB — PHOSPHORUS: Phosphorus: 3.3 mg/dL (ref 2.3–4.6)

## 2013-06-06 LAB — MAGNESIUM: Magnesium: 1.8 mg/dL (ref 1.5–2.5)

## 2013-06-06 MED ORDER — OXYCODONE-ACETAMINOPHEN 5-325 MG PO TABS
1.0000 | ORAL_TABLET | Freq: Four times a day (QID) | ORAL | Status: DC | PRN
  Administered 2013-06-06 – 2013-06-08 (×8): 2 via ORAL
  Filled 2013-06-06 (×8): qty 2

## 2013-06-06 MED ORDER — MORPHINE SULFATE 2 MG/ML IJ SOLN
2.0000 mg | INTRAMUSCULAR | Status: DC | PRN
  Administered 2013-06-06 – 2013-06-07 (×3): 2 mg via INTRAVENOUS
  Filled 2013-06-06 (×3): qty 1

## 2013-06-06 MED ORDER — SODIUM CHLORIDE 0.9 % IV SOLN
INTRAVENOUS | Status: DC
  Administered 2013-06-06 – 2013-06-07 (×5): via INTRAVENOUS
  Administered 2013-06-08: 125 mL/h via INTRAVENOUS

## 2013-06-06 MED ORDER — ENOXAPARIN SODIUM 40 MG/0.4ML ~~LOC~~ SOLN
40.0000 mg | SUBCUTANEOUS | Status: DC
  Administered 2013-06-06 – 2013-06-07 (×2): 40 mg via SUBCUTANEOUS
  Filled 2013-06-06 (×3): qty 0.4

## 2013-06-06 MED ORDER — METOPROLOL TARTRATE 25 MG PO TABS
25.0000 mg | ORAL_TABLET | Freq: Two times a day (BID) | ORAL | Status: DC
  Administered 2013-06-06 – 2013-06-08 (×5): 25 mg via ORAL
  Filled 2013-06-06 (×6): qty 1

## 2013-06-06 MED ORDER — HYDROMORPHONE HCL PF 1 MG/ML IJ SOLN: 1.0000 mg | INTRAMUSCULAR | Status: DC | PRN

## 2013-06-06 MED ORDER — DEXTROSE 5 % IV SOLN
1.0000 g | INTRAVENOUS | Status: DC
  Administered 2013-06-06 – 2013-06-07 (×2): 1 g via INTRAVENOUS
  Filled 2013-06-06 (×3): qty 10

## 2013-06-06 NOTE — Consult Note (Signed)
Subjective:   HPI  The patient is a 67 year old male who was admitted to the hospital because of upper abdominal pain. He was found to have an elevated lipase greater than 3000. A CT scan of the abdomen showed evidence of acute pancreatitis. He does not drink alcohol very much. He had a cholecystectomy in 2010 after he had an episode of gallstone pancreatitis. A cholangiogram could not be done at the time of the surgery from reading the report. He is feeling better now but still has some upper abdominal discomfort.  Review of Systems No chest pain. Some shortness of breath.  Past Medical History  Diagnosis Date  . COPD (chronic obstructive pulmonary disease)   . Diabetes mellitus   . Obstructive sleep apnea on CPAP   . High cholesterol   . Hypertension   . Shortness of breath   . Pancreatitis    Past Surgical History  Procedure Laterality Date  . Cholecystectomy    . Hernia repair     History   Social History  . Marital Status: Married    Spouse Name: N/A    Number of Children: N/A  . Years of Education: N/A   Occupational History  . Not on file.   Social History Main Topics  . Smoking status: Former Games developer  . Smokeless tobacco: Never Used  . Alcohol Use: No  . Drug Use: No  . Sexual Activity: Not on file   Other Topics Concern  . Not on file   Social History Narrative  . No narrative on file   family history includes Cancer in his sister. Current facility-administered medications:0.9 %  sodium chloride infusion, , Intravenous, Continuous, Meredeth Ide, MD, Last Rate: 125 mL/hr at 06/06/13 1234;  acetaminophen (TYLENOL) suppository 650 mg, 650 mg, Rectal, Q6H PRN, Therisa Doyne, MD;  acetaminophen (TYLENOL) tablet 650 mg, 650 mg, Oral, Q6H PRN, Therisa Doyne, MD albuterol (PROVENTIL) (5 MG/ML) 0.5% nebulizer solution 2.5 mg, 2.5 mg, Nebulization, Q2H PRN, Therisa Doyne, MD;  cefTRIAXone (ROCEPHIN) 1 g in dextrose 5 % 50 mL IVPB, 1 g, Intravenous, Q24H,  Meredeth Ide, MD, 1 g at 06/06/13 1234;  hydrALAZINE (APRESOLINE) injection 10 mg, 10 mg, Intravenous, Q4H PRN, Therisa Doyne, MD insulin aspart (novoLOG) injection 0-9 Units, 0-9 Units, Subcutaneous, Q4H, Therisa Doyne, MD, 1 Units at 06/06/13 1248;  ipratropium (ATROVENT) nebulizer solution 0.5 mg, 0.5 mg, Nebulization, Q6H PRN, Therisa Doyne, MD;  morphine 2 MG/ML injection 2 mg, 2 mg, Intravenous, Q4H PRN, Meredeth Ide, MD, 2 mg at 06/06/13 1235;  ondansetron (ZOFRAN) injection 4 mg, 4 mg, Intravenous, Q6H PRN, Therisa Doyne, MD, 4 mg at 06/05/13 1455 ondansetron (ZOFRAN) tablet 4 mg, 4 mg, Oral, Q6H PRN, Therisa Doyne, MD;  oxyCODONE-acetaminophen (PERCOCET/ROXICET) 5-325 MG per tablet 1-2 tablet, 1-2 tablet, Oral, Q6H PRN, Meredeth Ide, MD, 2 tablet at 06/06/13 1026;  pantoprazole (PROTONIX) injection 40 mg, 40 mg, Intravenous, QHS, Therisa Doyne, MD, 40 mg at 06/05/13 2150;  simvastatin (ZOCOR) tablet 20 mg, 20 mg, Oral, q1800, Therisa Doyne, MD, 20 mg at 06/05/13 1715 sodium chloride 0.9 % injection 3 mL, 3 mL, Intravenous, Q12H, Therisa Doyne, MD, 3 mL at 06/06/13 1107 No Known Allergies   Objective:     BP 159/68  Pulse 101  Temp(Src) 97.8 F (36.6 C) (Oral)  Resp 19  Ht 5\' 11"  (1.803 m)  Wt 128.822 kg (284 lb)  BMI 39.63 kg/m2  SpO2 98%  He does not appear in any acute distress  Heart regular rhythm no murmurs  Lungs decreased breath sounds  Abdomen: Bowel sounds are present but diminished, soft, some epigastric discomfort on palpation    Laboratory No components found with this basename: d1      Assessment:     Acute pancreatitis. The patient does have elevation of LFTs, and a prior history of gallstone pancreatitis. I suspect that this is compatible with gallstone pancreatitis. He never did have a cholangiogram.      Plan:     Treat pancreatitis medically at this time with IV fluids and supportive care. I will order an MRCP  as an initial evaluation to see if he might have retained common bile duct stones. It is possible he could of passed a stone. We will check the MRCP. Lab Results  Component Value Date   HGB 13.6 06/06/2013   HGB 15.3 06/05/2013   HGB 11.8* 06/26/2011   HCT 42.7 06/06/2013   HCT 46.0 06/05/2013   HCT 37.7* 06/26/2011   ALKPHOS 178* 06/06/2013   ALKPHOS 224* 06/05/2013   ALKPHOS 182* 06/26/2011   AST 105* 06/06/2013   AST 389* 06/05/2013   AST 85* 06/26/2011   ALT 246* 06/06/2013   ALT 323* 06/05/2013   ALT 159* 06/26/2011

## 2013-06-06 NOTE — Progress Notes (Signed)
Pt brought down to MRI for MRCP, pt abdominal girth was too large to fit in the scanner and could not be scanned.  Pt taken back to room.

## 2013-06-06 NOTE — Progress Notes (Signed)
Patient's wife upset that patient has not eaten in 3 days. Etiology/treatment of pancreatitis explained to patient and wife. Demands that patient eats. Patient's wife is also upset that patient couldn't have MRI d/t size limitations. States she was not informed of plan of care for patient. Dr. Sharl Ma and Dr. Evette Cristal saw patient this am. Wife demands to speak with Dr. Sharl Ma this evening; Dr. Sharl Ma notified. Clear liquid diet ordered per Dr. Sharl Ma. Will continue to monitor.Mamie Levers

## 2013-06-06 NOTE — Progress Notes (Signed)
[facility] PROGRESS NOTE  Clinton Young:[id number] DOB: 25-Oct-1945 DOA: 06/05/2013 PCP: [facility], [place]  Interval history 67 year old male who was admitted to the hospital because of upper abdominal pain, patient was found to have elevated lipase up to 3000. Patient has had cholecystectomy in 2010 and and does not drink alcohol regularly. As per patient he drinks 2 packs of beer in a year. His last drink was about 3 weeks ago. Lipase has now come down to 653. Patient still continues to have abdominal pain. Cause of pancreatitis is not clear at this time.   Assessment/Plan: 1. Pain-patient has abdominal pain due to pancreatitis and also possible UTI. Dilaudid was started but patient could not tolerate so switching back to morphine 2 mg IV every 4 hours when necessary. Will change the Vicodin to Percocet 5/325 1-2 tabs q 6 hr prn  2. UTI- UA is abnormal, will obtain urine culture and start empiric rocephin.  3. Acute pancreatitis- ? Cause, he does have elevated AST ALT along with bilirubin. But he had cholecystectomy done in 2010, patient is not avid alcohol drinker. Might need MRCP to look for stone in the CBD. We'll obtain GI consult for further evaluation and management. In the meantime patient will be continued on IV normal saline at 125 mL per hour.  4. Leukocytosis-likely due to UTI as well as inflammation from pancreatitis. White count has come down to 14,000. He will be started on Rocephin. We'll follow CBC in the morning  5. Acute kidney injury-patient's baseline creatinine is around 1.2, and it was elevated to 1.6. Today creatinine is 1.36. We'll continue the IV hydration and follow BMP in the morning  6. Hypertension-patient was on lisinopril 10 mg by mouth daily at home which has been on hold in the hospital. Patient's blood pressure started to rise, we'll start him on metoprolol 25 mg by mouth twice a day. Will not start ACE inhibitor at this time due to  worsening renal function.  7. Diabetes mellitus -patient was on glipizide and metformin at home. Both his medications on hold in the hospital, will continue him on sliding scale insulin. Blood glucose have been stable in 130s. Hemoglobin A1c 6.5.  8. COPD-patient is currently not in exacerbation. His breathing is at his baseline, continue with Duoneb nebulizers every 6 hours when necessary for dyspnea   9. DVT prophylaxis- we'll start Lovenox for DVT prophylaxis.    Code Status: DNR Family Communication: *Discussed with patient in detail, no family at bedside Disposition Plan: *home when stable   Consultants:  GI  Procedures:  None  Antibiotics:  None  HPI/Subjective: Patient complains of abdominal pain especially in the suprapubic region. He also is continent of dark-colored urine. Abdominal pain is not controlled with morphine and Vicodin combination. Patient currently could not tolerate Dilaudid as meds him of dizzy and drowsy.  Objective: Filed Vitals:   06/06/13 0814  BP: 123/73  Pulse: 91  Temp: 98.2 F (36.8 C)  Resp: 18    Intake/Output Summary (Last 24 hours) at 06/06/13 0846 Last data filed at 06/06/13 0328  Gross per 24 hour  Intake      0 ml  Output   1200 ml  Net  -1200 ml   Filed Weights   06/05/13 0033  Weight: 128.822 kg (284 lb)    Exam:  Physical Exam: Head: Normocephalic, atraumatic.  Eyes: No signs of jaundice, EOMI Nose: Mucous membranes dry.  Throat: Oropharynx nonerythematous, no exudate appreciated.  Neck: supple,No deformities, masses, or tenderness noted. Lungs: Normal respiratory effort. B/L Clear to auscultation, no crackles or wheezes.  Heart: Regular RR. S1 and S2 normal  Abdomen: BS normoactive. Soft, distended, positive tender to palpation in the suprapubic region Extremities: No pretibial edema, no erythema   Data Reviewed: Basic Metabolic Panel:  Recent Labs Lab 06/05/13 0138 06/05/13 0142 06/06/13 0440  NA 138   --  140  K 4.3  --  4.1  CL 101  --  102  CO2 23  --  28  GLUCOSE 259*  --  123*  BUN 19  --  21  CREATININE 1.31 1.60* 1.36*  CALCIUM 9.2  --  9.0  MG  --   --  1.8  PHOS  --   --  3.3   Liver Function Tests:  Recent Labs Lab 06/05/13 0138 06/06/13 0440  AST 389* 105*  ALT 323* 246*  ALKPHOS 224* 178*  BILITOT 1.8* 2.1*  PROT 7.0 6.7  ALBUMIN 3.7 3.3*    Recent Labs Lab 06/05/13 0138 06/06/13 0440  LIPASE >3000* 654*   No results found for this basename: AMMONIA,  in the last 168 hours CBC:  Recent Labs Lab 06/05/13 0138 06/06/13 0440  WBC 21.5* 14.7*  NEUTROABS 19.4*  --   HGB 15.3 13.6  HCT 46.0 42.7  MCV 86.6 89.1  PLT 288 228   Cardiac Enzymes:  Recent Labs Lab 06/05/13 0138 06/05/13 0845 06/05/13 1330 06/05/13 1814  TROPONINI <0.30 <0.30 <0.30 <0.30   BNP (last 3 results) No results found for this basename: PROBNP,  in the last 8760 hours CBG:  Recent Labs Lab 06/05/13 1653 06/05/13 1953 06/05/13 2019 06/05/13 2345 06/06/13 0335  GLUCAP 150* 141* 152* 141* 133*    No results found for this or any previous visit (from the past 240 hour(s)).   Studies: Ct Abdomen Pelvis W Contrast  06/05/2013   *RADIOLOGY REPORT*  Clinical Data: Epigastric abdominal pain and chest pain.  CT ABDOMEN AND PELVIS WITH CONTRAST  Technique:  Multidetector CT imaging of the abdomen and pelvis was performed following the standard protocol during bolus administration of intravenous contrast.  Contrast: OMNIPAQUE IOHEXOL 300 MG/ML  SOLN  Comparison: CT of the abdomen and pelvis, and renal ultrasound performed 06/21/2011  Findings: Bibasilar atelectasis or scarring is noted.  Soft tissue inflammation is noted surrounding the entirety of the pancreas, with associated free fluid tracking about the pancreas and duodenum, extending inferiorly along the left mid abdomen, anterior to Gerota's fascia.  No free fluid is seen in the pelvis. There is no evidence of  devascularization or pseudocyst formation.  The liver and spleen are unremarkable in appearance.  The patient is status post cholecystectomy, with clips along the gallbladder fossa.  The adrenal glands are unremarkable in appearance.  Nonspecific perinephric stranding is noted bilaterally.  The kidneys are otherwise unremarkable in appearance.  There is no evidence of hydronephrosis.  No renal or ureteral stones are seen.  The more distal small bowel is unremarkable in appearance.  The stomach is within normal limits.  No acute vascular abnormalities are seen.  The appendix is normal in caliber, without evidence for appendicitis.  Diffuse diverticulosis is noted along the descending and sigmoid colon, without evidence of diverticulitis.  The colon is otherwise unremarkable in appearance.  The bladder is mildly distended and grossly unremarkable in appearance.  The prostate remains normal in size.  No inguinal lymphadenopathy is seen.  No acute osseous abnormalities  are identified.  Multilevel vacuum phenomenon and disc space narrowing are noted along the lumbar spine, with associated endplate sclerotic change and underlying facet disease.  IMPRESSION:  1.  Acute pancreatitis noted, with soft tissue inflammation about the entirety of the pancreas, and free fluid tracking inferiorly along the left mid abdomen.  No free fluid seen within the pelvis. No evidence of devascularization or pseudocyst formation at this time. 2.  Bibasilar atelectasis or scarring noted. 3.  Diffuse diverticulosis along the descending and sigmoid colon, without evidence of diverticulitis. 4.  Mild degenerative change along the lumbar spine.   Original Report Authenticated By: Tonia Ghent, M.D.   Dg Chest Port 1 View  06/05/2013   *RADIOLOGY REPORT*  Clinical Data: Shortness of breath.  Assess for free intra- abdominal air.  PORTABLE CHEST - 1 VIEW  Comparison: Chest radiograph performed 06/21/2011  Findings: The lungs are relatively well  expanded.  Patchy bibasilar airspace opacities raise concern for mild multifocal pneumonia, given the patient's symptoms.  No pleural effusion or pneumothorax is seen.  The cardiomediastinal silhouette remains normal in size.  No acute osseous abnormalities are identified.  IMPRESSION: Patchy bibasilar airspace opacities raise concern for mild multifocal pneumonia, given the patient's symptoms.   Original Report Authenticated By: Tonia Ghent, M.D.    Scheduled Meds: . cefTRIAXone (ROCEPHIN)  IV  1 g Intravenous Q24H  . insulin aspart  0-9 Units Subcutaneous Q4H  . pantoprazole (PROTONIX) IV  40 mg Intravenous QHS  . simvastatin  20 mg Oral q1800  . sodium chloride  3 mL Intravenous Q12H   Continuous Infusions:   Active Problems:   COPD (chronic obstructive pulmonary disease)   Diabetes mellitus   Pancreatitis   Hypertension    Time spent: *25 minutes    [facility]  [facility] Pager [phone]. If 7PM-7AM, please contact night-coverage at [email or web address], password [facility] 06/06/2013, 8:46 AM  LOS: 1 day

## 2013-06-07 LAB — COMPREHENSIVE METABOLIC PANEL
ALT: 131 U/L — ABNORMAL HIGH (ref 0–53)
AST: 38 U/L — ABNORMAL HIGH (ref 0–37)
Albumin: 3 g/dL — ABNORMAL LOW (ref 3.5–5.2)
Alkaline Phosphatase: 150 U/L — ABNORMAL HIGH (ref 39–117)
BUN: 18 mg/dL (ref 6–23)
CO2: 27 mEq/L (ref 19–32)
Chloride: 103 mEq/L (ref 96–112)
Creatinine, Ser: 1.16 mg/dL (ref 0.50–1.35)
GFR calc non Af Amer: 63 mL/min — ABNORMAL LOW (ref >90)
Potassium: 4.4 mEq/L (ref 3.5–5.1)
Sodium: 137 mEq/L (ref 135–145)
Total Bilirubin: 1.1 mg/dL (ref 0.3–1.2)

## 2013-06-07 LAB — GLUCOSE, CAPILLARY
Glucose-Capillary: 104 mg/dL — ABNORMAL HIGH (ref 70–99)
Glucose-Capillary: 106 mg/dL — ABNORMAL HIGH (ref 70–99)
Glucose-Capillary: 107 mg/dL — ABNORMAL HIGH (ref 70–99)
Glucose-Capillary: 108 mg/dL — ABNORMAL HIGH (ref 70–99)
Glucose-Capillary: 111 mg/dL — ABNORMAL HIGH (ref 70–99)

## 2013-06-07 LAB — CBC
HCT: 39.2 % (ref 39.0–52.0)
MCV: 88.5 fL (ref 78.0–100.0)
Platelets: 189 10*3/uL (ref 150–400)
RBC: 4.43 MIL/uL (ref 4.22–5.81)
RDW: 15.5 % (ref 11.5–15.5)
WBC: 13.1 10*3/uL — ABNORMAL HIGH (ref 4.0–10.5)

## 2013-06-07 LAB — URINE CULTURE: Colony Count: 40000

## 2013-06-07 NOTE — Progress Notes (Signed)
[facility] PROGRESS NOTE  Clinton Young:[id number] DOB: [birth year] DOA: 06/05/2013 PCP: [facility], MD  Interval history 67 year old male who was admitted to the hospital because of upper abdominal pain, patient was found to have elevated lipase up to 3000. Patient has had cholecystectomy in 2010 and and does not drink alcohol regularly. As per patient he drinks 2 packs of beer in a year. His last drink was about 3 weeks ago. Lipase has now come down to 653. Patient still continues to have abdominal pain. Cause of pancreatitis is not clear at this time. GI was consulted, and MRCP was ordered which could not be done at [place] as patient is morbidly obese and would not fit in the machine. Lipase is back to normal   Assessment/Plan: 1. Pain-patient has abdominal pain due to pancreatitis and also possible UTI. Dilaudid was started but patient could not tolerate so switching back to morphine 2 mg IV every 4 hours when necessary. Will change the Vicodin to Percocet 5/325 1-2 tabs q 6 hr prn  2. UTI- UA is abnormal, will obtain urine culture and start empiric rocephin.  Acute pancreatitis- ? Cause, he does have elevated AST ALT along with bilirubin. But he had cholecystectomy done in 2010, patient is not avid alcohol drinker. Might need MRCP to look for stone in the CBD. Lipase is back to normal. Patient has been started on clear liquid diet, and he is tolerating it well  3. Leukocytosis-likely due to UTI as well as inflammation from pancreatitis. White count has come down to 14,000. He has been started on Rocephin. We'll follow CBC in the morning  4. Acute kidney injury-resolved , patient's baseline creatinine is around 1.2, and it was elevated to 1.6. Today creatinine is 1.36. We'll continue the IV hydration and follow BMP in the morning  5. Hypertension-patient was on lisinopril 10 mg by mouth daily at home which has been on hold in the hospital. Patient's blood  pressure started to rise, we'll start him on metoprolol 25 mg by mouth twice a day. Will not start ACE inhibitor at this time due to worsening renal function.  6. Diabetes mellitus -patient was on glipizide and metformin at home. Both his medications on hold in the hospital, will continue him on sliding scale insulin. Blood glucose have been stable in 130s. Hemoglobin A1c 6.5.  7. COPD-patient is currently not in exacerbation. His breathing is at his baseline, continue with Duoneb nebulizers every 6 hours when necessary for dyspnea   8. DVT prophylaxis- we'll start Lovenox for DVT prophylaxis.    Code Status: DNR Family Communication: *Discussed with patient in detail, no family at bedside Disposition Plan: *home when stable   Consultants:  GI  Procedures:  None  Antibiotics:  None  HPI/Subjective: Patient complains of abdominal pain especially in the suprapubic region. He also is continent of dark-colored urine. Abdominal pain is not controlled with morphine and Vicodin combination. Patient currently could not tolerate Dilaudid as meds him of dizzy and drowsy. Patient still complains of abdominal pain, MRI could not be performed due to overweight. Urine culture is pending.  Objective: Filed Vitals:   06/07/13 0422  BP: 162/62  Pulse: 82  Temp: 98.3 F (36.8 C)  Resp: 18    Intake/Output Summary (Last 24 hours) at 06/07/13 0807 Last data filed at 06/07/13 0622  Gross per 24 hour  Intake   2040 ml  Output    800 ml  Net   1240  ml   Filed Weights   06/05/13 0033  Weight: 128.822 kg (284 lb)    Exam:  Physical Exam: Head: Normocephalic, atraumatic.  Eyes: No signs of jaundice, EOMI Nose: Mucous membranes dry.  Throat: Oropharynx nonerythematous, no exudate appreciated.  Neck: supple,No deformities, masses, or tenderness noted. Lungs: Normal respiratory effort. B/L Clear to auscultation, no crackles or wheezes.  Heart: Regular RR. S1 and S2 normal  Abdomen:  BS normoactive. Soft, distended, positive tender to palpation in the suprapubic region Extremities: No pretibial edema, no erythema   Data Reviewed: Basic Metabolic Panel:  Recent Labs Lab 06/05/13 0138 06/05/13 0142 06/06/13 0440  NA 138  --  140  K 4.3  --  4.1  CL 101  --  102  CO2 23  --  28  GLUCOSE 259*  --  123*  BUN 19  --  21  CREATININE 1.31 1.60* 1.36*  CALCIUM 9.2  --  9.0  MG  --   --  1.8  PHOS  --   --  3.3   Liver Function Tests:  Recent Labs Lab 06/05/13 0138 06/06/13 0440  AST 389* 105*  ALT 323* 246*  ALKPHOS 224* 178*  BILITOT 1.8* 2.1*  PROT 7.0 6.7  ALBUMIN 3.7 3.3*    Recent Labs Lab 06/05/13 0138 06/06/13 0440  LIPASE >3000* 654*   No results found for this basename: AMMONIA,  in the last 168 hours CBC:  Recent Labs Lab 06/05/13 0138 06/06/13 0440  WBC 21.5* 14.7*  NEUTROABS 19.4*  --   HGB 15.3 13.6  HCT 46.0 42.7  MCV 86.6 89.1  PLT 288 228   Cardiac Enzymes:  Recent Labs Lab 06/05/13 0138 06/05/13 0845 06/05/13 1330 06/05/13 1814  TROPONINI <0.30 <0.30 <0.30 <0.30   BNP (last 3 results) No results found for this basename: PROBNP,  in the last 8760 hours CBG:  Recent Labs Lab 06/06/13 1110 06/06/13 1618 06/06/13 2039 06/07/13 0009 06/07/13 0421  GLUCAP 137* 105* 157* 107* 111*    No results found for this or any previous visit (from the past 240 hour(s)).   Studies: No results found.  Scheduled Meds: . cefTRIAXone (ROCEPHIN)  IV  1 g Intravenous Q24H  . enoxaparin (LOVENOX) injection  40 mg Subcutaneous Q24H  . insulin aspart  0-9 Units Subcutaneous Q4H  . metoprolol tartrate  25 mg Oral BID  . pantoprazole (PROTONIX) IV  40 mg Intravenous QHS  . simvastatin  20 mg Oral q1800  . sodium chloride  3 mL Intravenous Q12H   Continuous Infusions: . sodium chloride 125 mL/hr at 06/07/13 1610    Active Problems:   COPD (chronic obstructive pulmonary disease)   Diabetes mellitus   Pancreatitis    Hypertension    Time spent: *25 minutes    [facility]  [facility] Pager [phone]. If 7PM-7AM, please contact night-coverage at [email or web address], password [facility] 06/07/2013, 8:07 AM  LOS: 2 days

## 2013-06-07 NOTE — Progress Notes (Signed)
[facility] Progress Note  Subjective: The patient is feeling better although he still has complaints of abdominal pain. He in the MRI machine and therefore the MRCP could not be done. His bilirubin is normal, alkaline phosphatase 150, ALT 131, AST 38, these are improvements. Lipase is 65 today also an improvement.  Objective: Vital signs in last 24 hours: Temp:  [97.8 F (36.6 C)-98.5 F (36.9 C)] 98.3 F (36.8 C) (10/09 0820) Pulse Rate:  [63-102] 63 (10/09 1014) Resp:  [18-20] 18 (10/09 0820) BP: (141-175)/(62-78) 146/78 mmHg (10/09 1014) SpO2:  [95 %-98 %] 95 % (10/09 0820) Weight change:    PE:  No distress  Heart regular rhythm  Lungs clear  Abdomen soft with some upper abdominal tenderness    Lab Results: Results for orders placed during the hospital encounter of 06/05/13 (from the past 24 hour(s))  GLUCOSE, CAPILLARY     Status: Abnormal   Collection Time    06/06/13 11:10 AM      Result Value Range   Glucose-Capillary 137 (*) 70 - 99 mg/dL  GLUCOSE, CAPILLARY     Status: Abnormal   Collection Time    06/06/13  4:18 PM      Result Value Range   Glucose-Capillary 105 (*) 70 - 99 mg/dL  GLUCOSE, CAPILLARY     Status: Abnormal   Collection Time    06/06/13  8:39 PM      Result Value Range   Glucose-Capillary 157 (*) 70 - 99 mg/dL  GLUCOSE, CAPILLARY     Status: Abnormal   Collection Time    06/07/13 12:09 AM      Result Value Range   Glucose-Capillary 107 (*) 70 - 99 mg/dL  GLUCOSE, CAPILLARY     Status: Abnormal   Collection Time    06/07/13  4:21 AM      Result Value Range   Glucose-Capillary 111 (*) 70 - 99 mg/dL  GLUCOSE, CAPILLARY     Status: Abnormal   Collection Time    06/07/13  8:17 AM      Result Value Range   Glucose-Capillary 111 (*) 70 - 99 mg/dL  CBC     Status: Abnormal   Collection Time    06/07/13  8:38 AM      Result Value Range   WBC 13.1 (*) 4.0 - 10.5 K/uL   RBC 4.43  4.22 - 5.81 MIL/uL   Hemoglobin 12.5 (*) 13.0  - 17.0 g/dL   HCT 16.1  09.6 - 04.5 %   MCV 88.5  78.0 - 100.0 fL   MCH 28.2  26.0 - 34.0 pg   MCHC 31.9  30.0 - 36.0 g/dL   RDW 40.9  81.1 - 91.4 %   Platelets 189  150 - 400 K/uL  COMPREHENSIVE METABOLIC PANEL     Status: Abnormal   Collection Time    06/07/13  8:38 AM      Result Value Range   Sodium 137  135 - 145 mEq/L   Potassium 4.4  3.5 - 5.1 mEq/L   Chloride 103  96 - 112 mEq/L   CO2 27  19 - 32 mEq/L   Glucose, Bld 127 (*) 70 - 99 mg/dL   BUN 18  6 - 23 mg/dL   Creatinine, Ser 7.82  0.50 - 1.35 mg/dL   Calcium 8.2 (*) 8.4 - 10.5 mg/dL   Total Protein 6.4  6.0 - 8.3 g/dL   Albumin 3.0 (*) 3.5 - 5.2 g/dL  AST 38 (*) 0 - 37 U/L   ALT 131 (*) 0 - 53 U/L   Alkaline Phosphatase 150 (*) 39 - 117 U/L   Total Bilirubin 1.1  0.3 - 1.2 mg/dL   GFR calc non Af Amer 63 (*) >90 mL/min   GFR calc Af Amer 73 (*) >90 mL/min  LIPASE, BLOOD     Status: Abnormal   Collection Time    06/07/13  8:38 AM      Result Value Range   Lipase 65 (*) 11 - 59 U/L    Studies/Results: @RISRSLT24 @    Assessment: Acute pancreatitis. I believe the etiology of this to be most likely passage of a CBD stone.  Plan: I would recommend continuing to treat medically for pancreatitis. Advanced diet as tolerated. I think that after discharge it would be reasonable to send him to an open MRI scanner where we can get an MRCP done to see if he has evidence of obvious choledocholithiasis.    Jolene Guyett F 06/07/2013, 11:02 AM  Lab Results  Component Value Date   HGB 12.5* 06/07/2013   HGB 13.6 06/06/2013   HGB 15.3 06/05/2013   HCT 39.2 06/07/2013   HCT 42.7 06/06/2013   HCT 46.0 06/05/2013   ALKPHOS 150* 06/07/2013   ALKPHOS 178* 06/06/2013   ALKPHOS 224* 06/05/2013   AST 38* 06/07/2013   AST 105* 06/06/2013   AST 389* 06/05/2013   ALT 131* 06/07/2013   ALT 246* 06/06/2013   ALT 323* 06/05/2013

## 2013-06-08 LAB — COMPREHENSIVE METABOLIC PANEL
ALT: 89 U/L — ABNORMAL HIGH (ref 0–53)
Albumin: 2.7 g/dL — ABNORMAL LOW (ref 3.5–5.2)
BUN: 15 mg/dL (ref 6–23)
Calcium: 8.1 mg/dL — ABNORMAL LOW (ref 8.4–10.5)
Chloride: 102 mEq/L (ref 96–112)
Creatinine, Ser: 0.92 mg/dL (ref 0.50–1.35)
Potassium: 3.6 mEq/L (ref 3.5–5.1)
Total Bilirubin: 0.7 mg/dL (ref 0.3–1.2)
Total Protein: 6.2 g/dL (ref 6.0–8.3)

## 2013-06-08 LAB — GLUCOSE, CAPILLARY
Glucose-Capillary: 112 mg/dL — ABNORMAL HIGH (ref 70–99)
Glucose-Capillary: 112 mg/dL — ABNORMAL HIGH (ref 70–99)

## 2013-06-08 MED ORDER — METOPROLOL TARTRATE 25 MG PO TABS: 25.0000 mg | ORAL_TABLET | Freq: Two times a day (BID) | ORAL | Status: DC

## 2013-06-08 MED ORDER — LEVOFLOXACIN 750 MG PO TABS
750.0000 mg | ORAL_TABLET | Freq: Every day | ORAL | Status: DC
  Administered 2013-06-08: 750 mg via ORAL
  Filled 2013-06-08: qty 1

## 2013-06-08 MED ORDER — OXYCODONE-ACETAMINOPHEN 5-325 MG PO TABS: 1.0000 | ORAL_TABLET | Freq: Four times a day (QID) | ORAL | Status: DC | PRN

## 2013-06-08 MED ORDER — LEVOFLOXACIN 750 MG PO TABS: 750.0000 mg | ORAL_TABLET | Freq: Every day | ORAL | Status: DC

## 2013-06-08 MED ORDER — PANTOPRAZOLE SODIUM 40 MG PO TBEC
40.0000 mg | DELAYED_RELEASE_TABLET | Freq: Every day | ORAL | Status: DC
  Administered 2013-06-08: 40 mg via ORAL
  Filled 2013-06-08: qty 1

## 2013-06-08 NOTE — Discharge Summary (Signed)
Physician Discharge Summary  Clinton Young ZOX:[id number] DOB: [birth year] DOA: 06/05/2013  PCP: [facility], MD  Admit date: 06/05/2013 Discharge date: 06/08/2013  Time spent: 50* minutes  Recommendations for Outpatient Follow-up:  1. *Follow up GI Dr Evette Cristal in one week for MRI Abdomen as outpatient 2. Follow up PCP in 2 weeks  Discharge Diagnoses:  Principal diagnosis- pancreatitis resolved Active Problems:   COPD (chronic obstructive pulmonary disease)   Diabetes mellitus   Pancreatitis   Hypertension   Discharge Condition: Stable  Diet recommendation: Low salt diet  Filed Weights   06/05/13 0033  Weight: 128.822 kg (284 lb)    History of present illness:  67 y.o. male has a past medical history of COPD (chronic obstructive pulmonary disease); Diabetes mellitus; Obstructive sleep apnea on CPAP; and High cholesterol.  Presented with  Since yesterday he started to have pain and after he ate the pain has worsened. He have had some nausea and vomiting. He have had some chills but no fever. He presented to ER and had abdominal CT scan done that showed  Pancreatitis. Hospitalist was called for an admission. A consult has been also placed to surgery but they felt it was more of a medical management.  Patient denies alcohol abuse.    Hospital Course:  *67 year old male who was admitted to the hospital because of upper abdominal pain, patient was found to have elevated lipase up to 3000. Patient has had cholecystectomy in 2010 and and does not drink alcohol regularly. As per patient he drinks 2 packs of beer in a year. His last drink was about 3 weeks ago. Lipase has now come down to 653. Patient still continues to have abdominal pain. Cause of pancreatitis is not clear at this time. GI was consulted, and MRCP was ordered which could not be done at [place] as patient is morbidly obese and would not fit in the machine. Lipase is back to normal 1. Pain-patient has  abdominal pain due to pancreatitis and also possible UTI. Dilaudid was started but patient could not tolerate so switched  back to morphine 2 mg IV every 4 hours when necessary. changed the Vicodin to Percocet 5/325 1-2 tabs q 6 hr prn. Will discharge him on Percocet prn. 2. UTI- UA was  Abnormal, urine culture only grew morphophytes. 3.  Acute pancreatitis- ? Cause, he does have elevated AST ALT along with bilirubin. But he had cholecystectomy done in 2010, patient is not avid alcohol drinker. Might needed MRCP to look for stone in the CBD. Which could not be done as he is too obese to fit in the machine. Lipase is back to normal. Patient has been started on regular diet and he is tolearting it well, called and discussed with Dr Evette Cristal, he will follow up with him as outpatient. 3. Leukocytosis-likely due to UTI as well as inflammation from pancreatitis. White count has come down to 14,000. He was  started on Rocephin. Urine culture is negative, CXR showed ? Mild multifocal pneumonia. Will send him home on Po levaquin 750 mg po daily x 7 days. 4. Acute kidney injury-resolved , patient's baseline creatinine is around 1.2, and it was elevated to 1.6. Today creatinine is back to normal. 5. Hypertension-patient was on lisinopril 10 mg by mouth daily at home which has been on hold in the hospital. Patient's blood pressure started to rise, was started on metoprolol 25 mg by mouth twice a day. Will also restart Lisinopril 5 mg po daily.  6. Diabetes mellitus -patient was on glipizide and metformin at home. Both his medications on hold in the hospital, was started on sliding scale insulin. Blood glucose have been stable in 130s. Hemoglobin A1c 6.5.Will send him on home regimen. 7. COPD-patient is currently not in exacerbation. His breathing is at his baseline, with Duoneb nebulizers every 6 hours when necessary for dyspnea. Continue with Home o2and prn Ipratropium  nebs.   Procedures:  *None  Consultations:  GI  Discharge Exam: Filed Vitals:   06/08/13 0425  BP: 161/77  Pulse: 78  Temp: 97.8 F (36.6 C)  Resp: 20    General: *Apear in no acute distress Cardiovascular: S1s2 RRR Respiratory: *Clear bilaterally Ext : No edema  Discharge Instructions  Discharge Orders   Future Orders Complete By Expires   Diet - low sodium heart healthy  As directed    Increase activity slowly  As directed        Medication List    STOP taking these medications       potassium chloride 10 MEQ tablet  Commonly known as:  K-DUR,KLOR-CON      TAKE these medications       allopurinol 100 MG tablet  Commonly known as:  ZYLOPRIM  Take 200 mg by mouth daily.     aspirin 81 MG tablet  Take 81 mg by mouth daily.     buPROPion 150 MG 12 hr tablet  Commonly known as:  ZYBAN  Take 150 mg by mouth 2 (two) times daily.     flunisolide 25 MCG/ACT (0.025%) Soln  Commonly known as:  NASALIDE  Inhale 2 sprays into the lungs 2 (two) times daily.     glipiZIDE 10 MG tablet  Commonly known as:  GLUCOTROL  Take 10 mg by mouth 2 (two) times daily before a meal.     IPRATROPIUM BROMIDE IN  Inhale 17 mcg into the lungs.     Krill Oil 300 MG Caps  Take 300 mg by mouth 2 (two) times daily.     levofloxacin 750 MG tablet  Commonly known as:  LEVAQUIN  Take 1 tablet (750 mg total) by mouth daily.     lisinopril 10 MG tablet  Commonly known as:  PRINIVIL,ZESTRIL  Take 5 mg by mouth daily.     metFORMIN 850 MG tablet  Commonly known as:  GLUCOPHAGE  Take 850 mg by mouth 2 (two) times daily with a meal.     metoprolol tartrate 25 MG tablet  Commonly known as:  LOPRESSOR  Take 1 tablet (25 mg total) by mouth 2 (two) times daily.     oxyCODONE-acetaminophen 5-325 MG per tablet  Commonly known as:  PERCOCET/ROXICET  Take 1-2 tablets by mouth every 6 (six) hours as needed.     pravastatin 40 MG tablet  Commonly known as:  PRAVACHOL  Take 40  mg by mouth daily. 1/2 tablet     Vitamin D3 1000 UNITS Caps  Take 2,000 Units by mouth daily.       No Known Allergies     Follow-up Information   Follow up with Graylin Shiver, MD In 1 week. (Call to make an appointment)    Specialty:  Gastroenterology   Contact information:   [address] [place] [address] [phone]       Follow up with [facility], MD In 2 weeks.   Specialty:  General Practice   Contact information:   [address]  [place] [address] [phone]        The results of significant diagnostics from this hospitalization (including imaging, microbiology, ancillary and laboratory) are listed below for reference.    Significant Diagnostic Studies: Ct Abdomen Pelvis W Contrast  06/05/2013   *RADIOLOGY REPORT*  Clinical Data: Epigastric abdominal pain and chest pain.  CT ABDOMEN AND PELVIS WITH CONTRAST  Technique:  Multidetector CT imaging of the abdomen and pelvis was performed following the standard protocol during bolus administration of intravenous contrast.  Contrast: OMNIPAQUE IOHEXOL 300 MG/ML  SOLN  Comparison: CT of the abdomen and pelvis, and renal ultrasound performed 06/21/2011  Findings: Bibasilar atelectasis or scarring is noted.  Soft tissue inflammation is noted surrounding the entirety of the pancreas, with associated free fluid tracking about the pancreas and duodenum, extending inferiorly along the left mid abdomen, anterior to Gerota's fascia.  No free fluid is seen in the pelvis. There is no evidence of devascularization or pseudocyst formation.  The liver and spleen are unremarkable in appearance.  The patient is status post cholecystectomy, with clips along the gallbladder fossa.  The adrenal glands are unremarkable in appearance.  Nonspecific perinephric stranding is noted bilaterally.  The kidneys are otherwise unremarkable in appearance.  There is no evidence of hydronephrosis.  No renal or ureteral  stones are seen.  The more distal small bowel is unremarkable in appearance.  The stomach is within normal limits.  No acute vascular abnormalities are seen.  The appendix is normal in caliber, without evidence for appendicitis.  Diffuse diverticulosis is noted along the descending and sigmoid colon, without evidence of diverticulitis.  The colon is otherwise unremarkable in appearance.  The bladder is mildly distended and grossly unremarkable in appearance.  The prostate remains normal in size.  No inguinal lymphadenopathy is seen.  No acute osseous abnormalities are identified.  Multilevel vacuum phenomenon and disc space narrowing are noted along the lumbar spine, with associated endplate sclerotic change and underlying facet disease.  IMPRESSION:  1.  Acute pancreatitis noted, with soft tissue inflammation about the entirety of the pancreas, and free fluid tracking inferiorly along the left mid abdomen.  No free fluid seen within the pelvis. No evidence of devascularization or pseudocyst formation at this time. 2.  Bibasilar atelectasis or scarring noted. 3.  Diffuse diverticulosis along the descending and sigmoid colon, without evidence of diverticulitis. 4.  Mild degenerative change along the lumbar spine.   Original Report Authenticated By: Tonia Ghent, M.D.   Dg Chest Port 1 View  06/05/2013   *RADIOLOGY REPORT*  Clinical Data: Shortness of breath.  Assess for free intra- abdominal air.  PORTABLE CHEST - 1 VIEW  Comparison: Chest radiograph performed 06/21/2011  Findings: The lungs are relatively well expanded.  Patchy bibasilar airspace opacities raise concern for mild multifocal pneumonia, given the patient's symptoms.  No pleural effusion or pneumothorax is seen.  The cardiomediastinal silhouette remains normal in size.  No acute osseous abnormalities are identified.  IMPRESSION: Patchy bibasilar airspace opacities raise concern for mild multifocal pneumonia, given the patient's symptoms.   Original  Report Authenticated By: Tonia Ghent, M.D.    Microbiology: Recent Results (from the past 240 hour(s))  URINE CULTURE     Status: None   Collection Time    06/06/13 11:23 AM      Result Value Range Status   Specimen Description URINE, CLEAN CATCH   Final   Special Requests NONE   Final   Culture  Setup Time  Final   Value: 06/06/2013 12:35     Performed at [facility] Count     Final   Value: 40,000 COLONIES/ML     Performed at [facility]   Culture     Final   Value: Multiple bacterial morphotypes present, none predominant. Suggest appropriate recollection if clinically indicated.     Performed at [facility]   Report Status 06/07/2013 FINAL   Final     Labs: Basic Metabolic Panel:  Recent Labs Lab 06/05/13 0138 06/05/13 0142 06/06/13 0440 06/07/13 0838 06/08/13 0835  NA 138  --  140 137 137  K 4.3  --  4.1 4.4 3.6  CL 101  --  102 103 102  CO2 23  --  28 27 24   GLUCOSE 259*  --  123* 127* 114*  BUN 19  --  21 18 15   CREATININE 1.31 1.60* 1.36* 1.16 0.92  CALCIUM 9.2  --  9.0 8.2* 8.1*  MG  --   --  1.8  --   --   PHOS  --   --  3.3  --   --    Liver Function Tests:  Recent Labs Lab 06/05/13 0138 06/06/13 0440 06/07/13 0838 06/08/13 0835  AST 389* 105* 38* 20  ALT 323* 246* 131* 89*  ALKPHOS 224* 178* 150* 138*  BILITOT 1.8* 2.1* 1.1 0.7  PROT 7.0 6.7 6.4 6.2  ALBUMIN 3.7 3.3* 3.0* 2.7*    Recent Labs Lab 06/05/13 0138 06/06/13 0440 06/07/13 0838  LIPASE >3000* 654* 65*   No results found for this basename: AMMONIA,  in the last 168 hours CBC:  Recent Labs Lab 06/05/13 0138 06/06/13 0440 06/07/13 0838  WBC 21.5* 14.7* 13.1*  NEUTROABS 19.4*  --   --   HGB 15.3 13.6 12.5*  HCT 46.0 42.7 39.2  MCV 86.6 89.1 88.5  PLT 288 228 189   Cardiac Enzymes:  Recent Labs Lab 06/05/13 0138 06/05/13 0845 06/05/13 1330 06/05/13 1814  TROPONINI <0.30 <0.30 <0.30 <0.30   BNP: BNP (last 3 results) No  results found for this basename: PROBNP,  in the last 8760 hours CBG:  Recent Labs Lab 06/07/13 1126 06/07/13 1605 06/07/13 2039 06/07/13 2342 06/08/13 0422  GLUCAP 104* 108* 106* 112* 112*       Signed:  Keygan Dumond S  [facility] 06/08/2013, 10:26 AM

## 2013-06-08 NOTE — Progress Notes (Signed)
Pain improved.Spoke with Dr. Sharl Ma regarding patient and wife's concerns.  Instructed to return to full liquid diet for the next few days to allow pancreas to rest per MD suggestion; patient and wife verbalized understanding. Discharge instructions along with med list and scripts provided. IV d/c'd with catheter intact. Patient transported via wheelchair to lobby for d/c home. Belongings with patient and family.Mamie Levers

## 2013-07-05 ENCOUNTER — Other Ambulatory Visit: Payer: Self-pay

## 2015-02-17 IMAGING — CT CT ABD-PELV W/ CM
2 of 5 series · 15 of 46 positions shown, 17 images · IV contrast (APPLIED)
Comparison: CT of the abdomen and pelvis, and renal ultrasound
performed 06/21/2011

CLINICAL DATA: Epigastric abdominal pain and chest pain.

CT ABDOMEN AND PELVIS WITH CONTRAST
TECHNIQUE: Multidetector CT imaging of the abdomen and pelvis was
performed following the standard protocol during bolus
administration of intravenous contrast.
Contrast: 100mL OMNIPAQUE IOHEXOL 300 MG/ML  SOLN

[Series 2: abd/ pelvis 5.0 i30f 1 · axial · 0.93mm/px · z∈[-590,-180]mm · 12 of 94 slices shown, 14 images]
[im 6/94  soft-tissue]
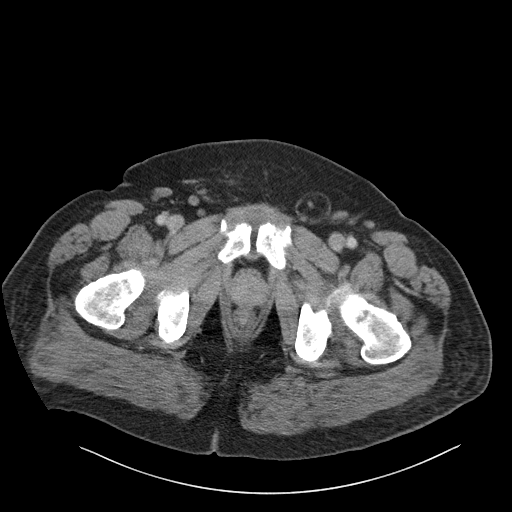
[im 6/94  bone]
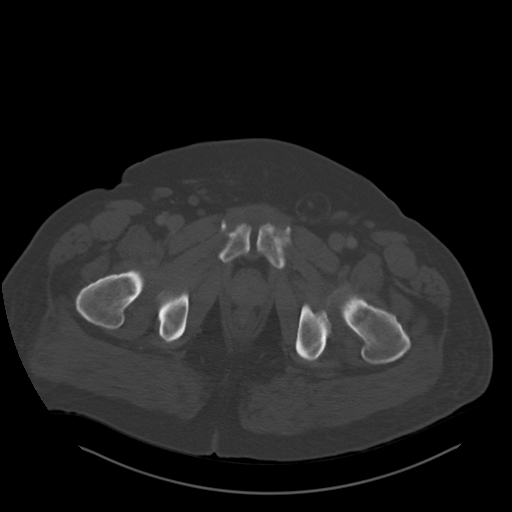
[im 17/94  soft-tissue]
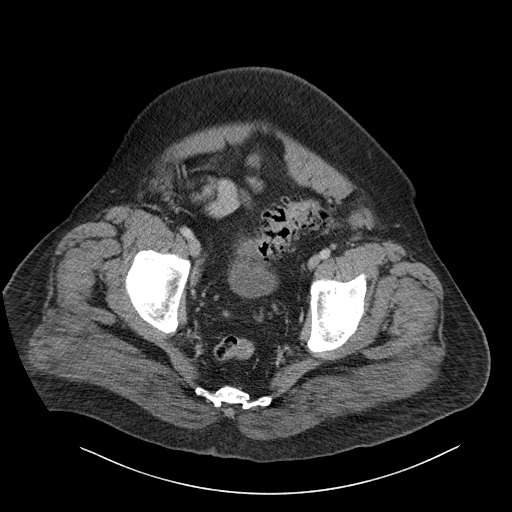
[im 22/94  soft-tissue]
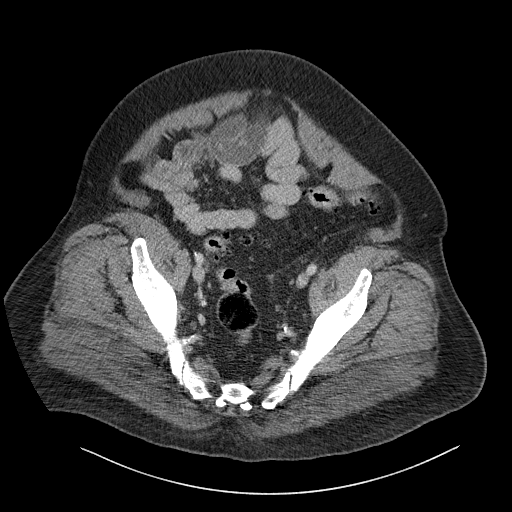
[im 28/94  soft-tissue]
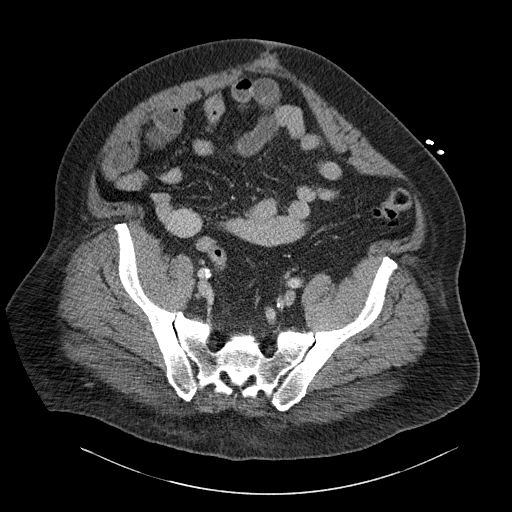
[im 39/94  soft-tissue]
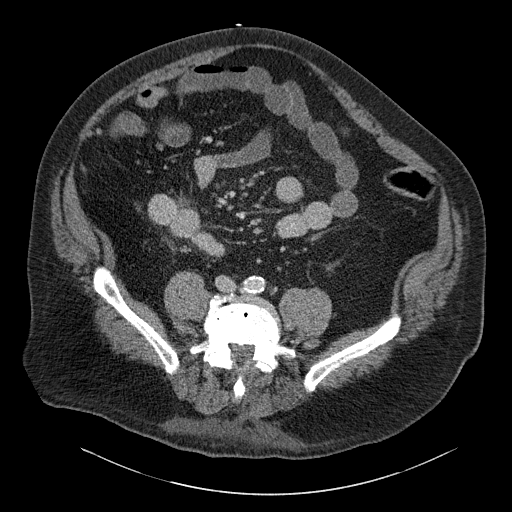
[im 44/94  soft-tissue]
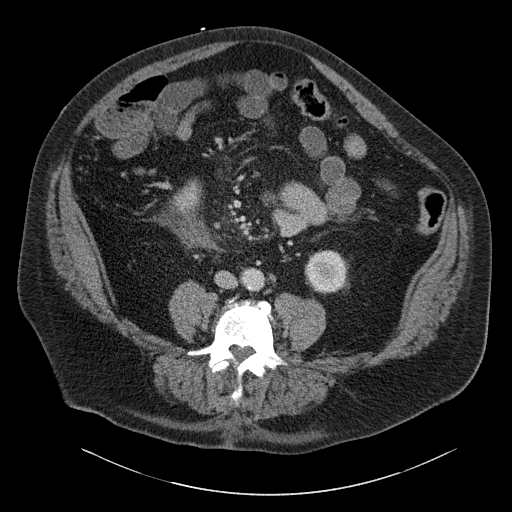
[im 50/94  soft-tissue]
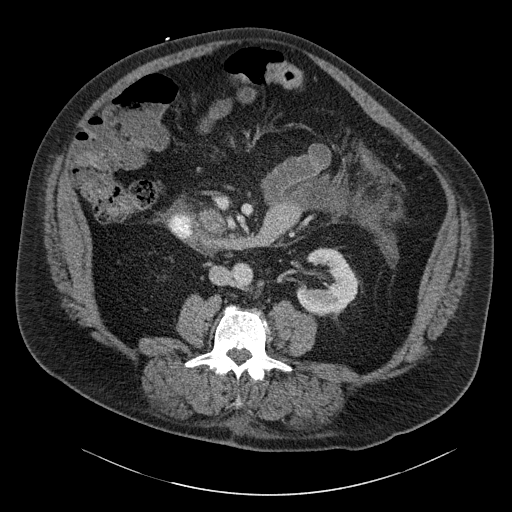
[im 61/94  soft-tissue]
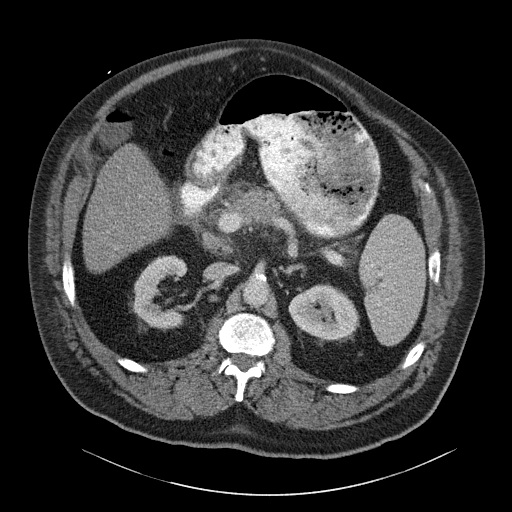
[im 66/94  soft-tissue]
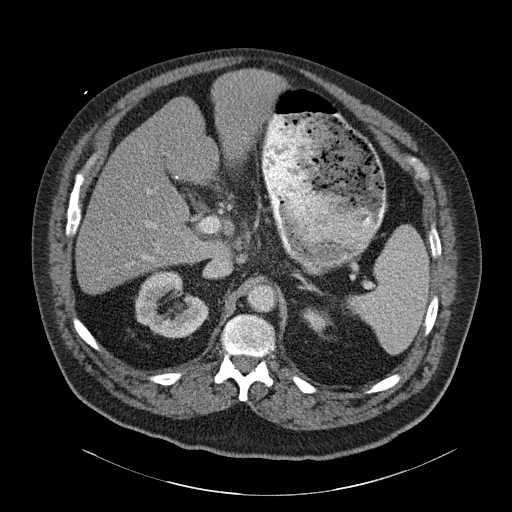
[im 66/94  bone]
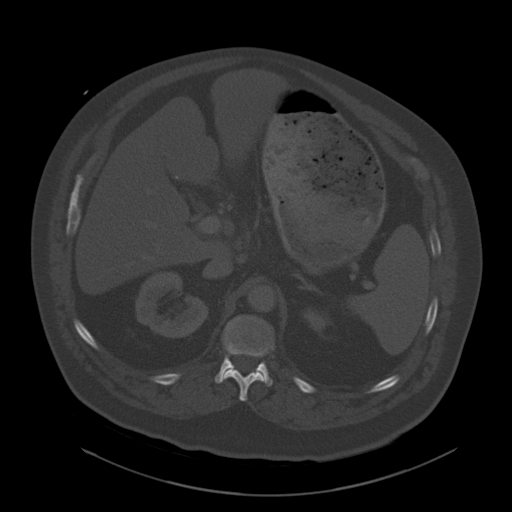
[im 72/94  soft-tissue]
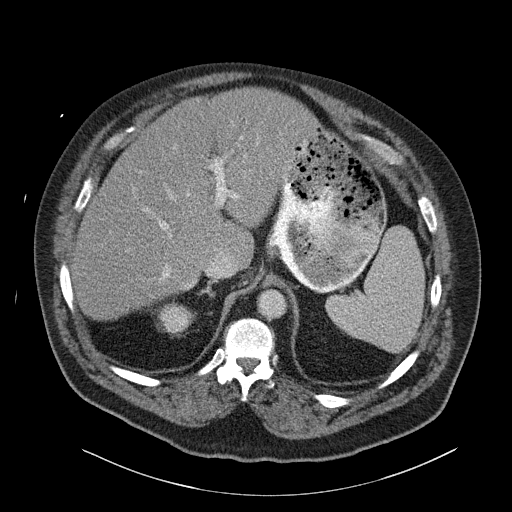
[im 83/94  soft-tissue]
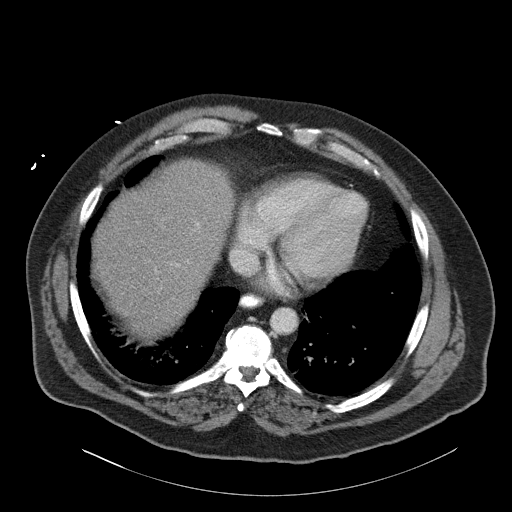
[im 88/94  soft-tissue]
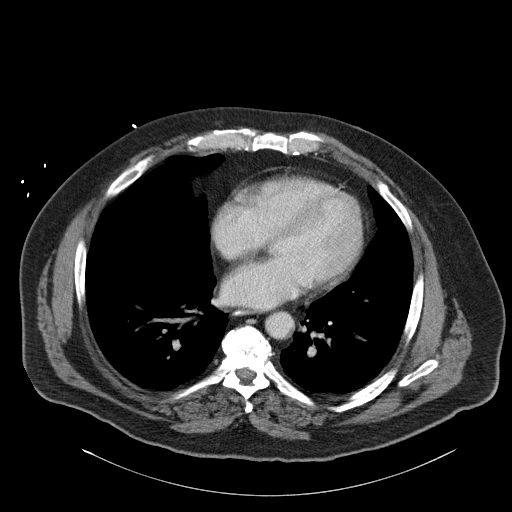

[Series 5: cor · coronal · 0.86mm/px · 3 of 188 slices shown]
[im 63/188  soft-tissue]
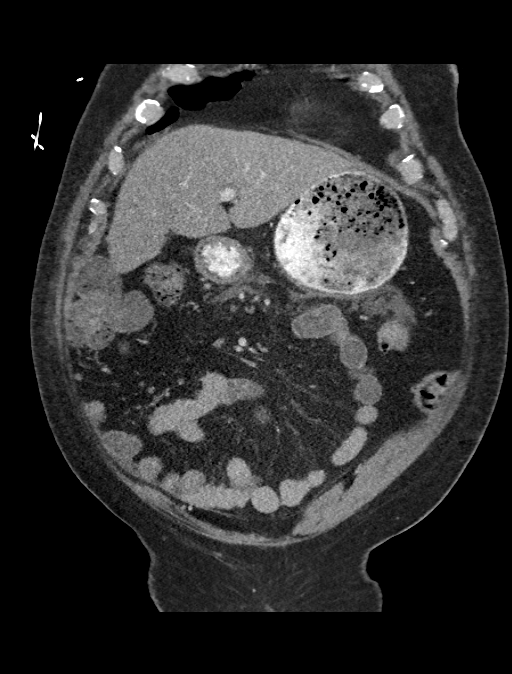
[im 84/188  soft-tissue]
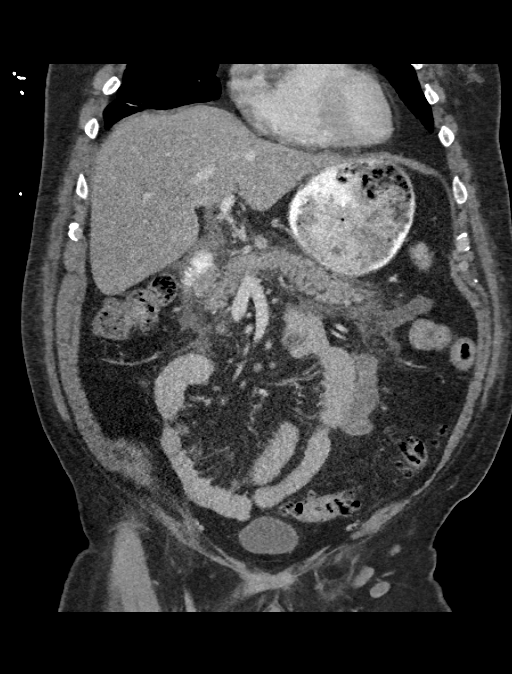
[im 104/188  soft-tissue]
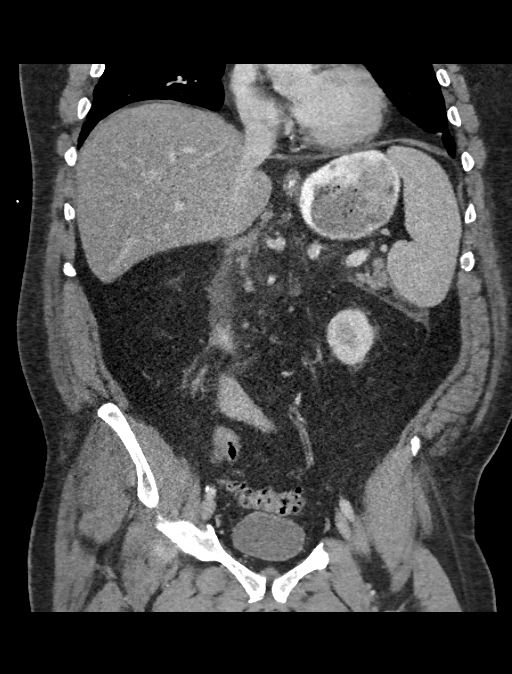

[15 of 46 positions shown; findings below may reference images not displayed]

FINDINGS: Bibasilar atelectasis or scarring is noted.

Soft tissue inflammation is noted surrounding the entirety of the
pancreas, with associated free fluid tracking about the pancreas
and duodenum, extending inferiorly along the left mid abdomen,
anterior to Gerota's fascia.  No free fluid is seen in the pelvis.
There is no evidence of devascularization or pseudocyst formation.

The liver and spleen are unremarkable in appearance.  The patient
is status post cholecystectomy, with clips along the gallbladder
fossa.  The adrenal glands are unremarkable in appearance.

Nonspecific perinephric stranding is noted bilaterally.  The
kidneys are otherwise unremarkable in appearance.  There is no
evidence of hydronephrosis.  No renal or ureteral stones are seen.

The more distal small bowel is unremarkable in appearance.  The
stomach is within normal limits.  No acute vascular abnormalities
are seen.

The appendix is normal in caliber, without evidence for
appendicitis.  Diffuse diverticulosis is noted along the descending
and sigmoid colon, without evidence of diverticulitis.  The colon
is otherwise unremarkable in appearance.

The bladder is mildly distended and grossly unremarkable in
appearance.  The prostate remains normal in size.  No inguinal
lymphadenopathy is seen.

No acute osseous abnormalities are identified.  Multilevel vacuum
phenomenon and disc space narrowing are noted along the lumbar
spine, with associated endplate sclerotic change and underlying
facet disease.
IMPRESSION: 1.  Acute pancreatitis noted, with soft tissue inflammation about
the entirety of the pancreas, and free fluid tracking inferiorly
along the left mid abdomen.  No free fluid seen within the pelvis.
No evidence of devascularization or pseudocyst formation at this
time.
2.  Bibasilar atelectasis or scarring noted.
3.  Diffuse diverticulosis along the descending and sigmoid colon,
without evidence of diverticulitis.
4.  Mild degenerative change along the lumbar spine.

## 2015-12-05 DIAGNOSIS — E1142 Type 2 diabetes mellitus with diabetic polyneuropathy: Secondary | ICD-10-CM | POA: Insufficient documentation

## 2015-12-05 DIAGNOSIS — E1140 Type 2 diabetes mellitus with diabetic neuropathy, unspecified: Secondary | ICD-10-CM | POA: Insufficient documentation

## 2015-12-05 DIAGNOSIS — E785 Hyperlipidemia, unspecified: Secondary | ICD-10-CM | POA: Insufficient documentation

## 2015-12-05 DIAGNOSIS — N183 Chronic kidney disease, stage 3 unspecified: Secondary | ICD-10-CM | POA: Insufficient documentation

## 2015-12-05 DIAGNOSIS — I25119 Atherosclerotic heart disease of native coronary artery with unspecified angina pectoris: Secondary | ICD-10-CM | POA: Insufficient documentation

## 2015-12-05 DIAGNOSIS — G4710 Hypersomnia, unspecified: Secondary | ICD-10-CM | POA: Insufficient documentation

## 2015-12-05 DIAGNOSIS — E1121 Type 2 diabetes mellitus with diabetic nephropathy: Secondary | ICD-10-CM | POA: Insufficient documentation

## 2017-08-30 DIAGNOSIS — D126 Benign neoplasm of colon, unspecified: Secondary | ICD-10-CM | POA: Insufficient documentation

## 2018-11-16 ENCOUNTER — Telehealth (HOSPITAL_COMMUNITY): Payer: Self-pay

## 2018-11-16 NOTE — Telephone Encounter (Signed)
Referral received from MD Swarma from [facility] for Pulmonary Rehab with diagnosis of COPD. [facility] Auth: [id number] Clinical review of pt faxed record. Pt appropriate for scheduling for Pulmonary rehab.  Will forward to support staff for scheduling and verification of insurance eligibility/benefits with pt consent.   Joycelyn Man, RN, BSN Cardiac and Pulmonary Rehab Nurse

## 2018-11-21 ENCOUNTER — Telehealth (HOSPITAL_COMMUNITY): Payer: Self-pay

## 2018-11-21 NOTE — Telephone Encounter (Signed)
Pt partner Pamala Hurry called and stated that someone called pt from this office.. told Pamala Hurry that we are closed for an additional 4 weeks but we will call pt to schedule once we start back scheduling. Pamala Hurry stated to call her phone number at [phone] once we start back scheduling. She stated that pt is interested in Cardiac Rehab.   Tedra Senegal. Support Rep II

## 2018-12-26 ENCOUNTER — Telehealth (HOSPITAL_COMMUNITY): Payer: Self-pay | Admitting: *Deleted

## 2019-01-31 ENCOUNTER — Telehealth (HOSPITAL_COMMUNITY): Payer: Self-pay | Admitting: *Deleted

## 2019-01-31 NOTE — Telephone Encounter (Signed)
Pt called thinking she had a message from pulmonary rehab.  Advised that we had not called her and I spoke to her on May 12.  Did talk with her regarding the option  of virtual rehab platform.  Pamala Hurry indicated that they did not have a smart device. Advised that we would continue to follow pt .  Will need new or revised authorization from the [place] when permitted to schedule. Cherre Huger, BSN Cardiac and Training and development officer

## 2019-06-21 ENCOUNTER — Encounter (HOSPITAL_COMMUNITY): Payer: Self-pay | Admitting: *Deleted

## 2019-06-21 NOTE — Progress Notes (Signed)
Received VA authorization - [id number] from Dr. Mali Marion [facility]) for this pt to participate in Pulmonary rehab with the diagnosis of COPD. Pt referred earlier this year but due to departmental closure for COVID - 19  Unable to move forward with scheduling.  Reviewed accompanied medical records supplied by the [place].  Pt is appropriate to schedule pulmonary rehab.  Will forward this authorization to the pulmonary rehab staff for scheduling.  Pt covid risk score 6. Cherre Huger, BSN Cardiac and Training and development officer

## 2019-06-22 ENCOUNTER — Telehealth (HOSPITAL_COMMUNITY): Payer: Self-pay

## 2019-07-09 ENCOUNTER — Other Ambulatory Visit: Payer: Self-pay

## 2019-07-09 ENCOUNTER — Encounter (HOSPITAL_COMMUNITY)
Admission: RE | Admit: 2019-07-09 | Discharge: 2019-07-09 | Disposition: A | Discharge status: Home / Self Care | Payer: Medicare Other | Source: Ambulatory Visit | Attending: Critical Care Medicine | Admitting: Critical Care Medicine

## 2019-07-09 NOTE — Progress Notes (Addendum)
Patient arrived for walk test/orientation to pulmonary rehab.  He arrived in a motorized scooter and was extremely short of breath after going to the bathroom.  We chatted a few minutes and he voiced that he is scheduled for a stress test in the next few weeks and the [place] is evaluating his heart for abnormalities.  He has not had chest pain, but he also is a diabetic.  At this point I did not continue with the walk test/ orientation due to this information.  Patient was given my department phone number when the test is completed along with the results.  He was agreeable and was sent home. A fax was sent to the [place] in [place] as to these findings. [id number]

## 2019-08-02 ENCOUNTER — Telehealth (HOSPITAL_COMMUNITY): Payer: Self-pay | Admitting: *Deleted

## 2019-08-02 NOTE — Telephone Encounter (Signed)
Called to see what the results of patient's stress test which was scheduled for 07/25/2019. LMTCB.

## 2019-08-15 DIAGNOSIS — I208 Other forms of angina pectoris: Secondary | ICD-10-CM | POA: Insufficient documentation

## 2019-08-15 DIAGNOSIS — I2089 Other forms of angina pectoris: Secondary | ICD-10-CM | POA: Insufficient documentation

## 2019-08-17 DIAGNOSIS — I509 Heart failure, unspecified: Secondary | ICD-10-CM | POA: Insufficient documentation

## 2019-08-17 DIAGNOSIS — R0602 Shortness of breath: Secondary | ICD-10-CM | POA: Insufficient documentation

## 2019-08-17 DIAGNOSIS — G4733 Obstructive sleep apnea (adult) (pediatric): Secondary | ICD-10-CM | POA: Insufficient documentation

## 2019-08-17 DIAGNOSIS — E8770 Fluid overload, unspecified: Secondary | ICD-10-CM | POA: Insufficient documentation

## 2019-08-28 ENCOUNTER — Telehealth (HOSPITAL_COMMUNITY): Payer: Self-pay

## 2019-08-28 NOTE — Telephone Encounter (Signed)
Clinton Young in pulmonary rehab advised me to close pt pulmonary rehab referral. Closed referral.

## 2020-01-21 DIAGNOSIS — J9621 Acute and chronic respiratory failure with hypoxia: Secondary | ICD-10-CM | POA: Insufficient documentation

## 2020-01-21 DIAGNOSIS — Z8719 Personal history of other diseases of the digestive system: Secondary | ICD-10-CM | POA: Insufficient documentation

## 2020-01-21 DIAGNOSIS — Z95820 Peripheral vascular angioplasty status with implants and grafts: Secondary | ICD-10-CM | POA: Insufficient documentation

## 2021-07-28 ENCOUNTER — Ambulatory Visit (INDEPENDENT_AMBULATORY_CARE_PROVIDER_SITE_OTHER): Payer: Medicare Other

## 2021-07-28 ENCOUNTER — Ambulatory Visit (INDEPENDENT_AMBULATORY_CARE_PROVIDER_SITE_OTHER): Payer: Medicare Other | Admitting: Sports Medicine

## 2021-07-28 ENCOUNTER — Encounter: Payer: Self-pay | Admitting: Sports Medicine

## 2021-07-28 ENCOUNTER — Other Ambulatory Visit: Payer: Self-pay

## 2021-07-28 DIAGNOSIS — M7731 Calcaneal spur, right foot: Secondary | ICD-10-CM

## 2021-07-28 DIAGNOSIS — M79672 Pain in left foot: Secondary | ICD-10-CM | POA: Diagnosis not present

## 2021-07-28 DIAGNOSIS — L98499 Non-pressure chronic ulcer of skin of other sites with unspecified severity: Secondary | ICD-10-CM | POA: Insufficient documentation

## 2021-07-28 DIAGNOSIS — M79671 Pain in right foot: Secondary | ICD-10-CM | POA: Diagnosis not present

## 2021-07-28 DIAGNOSIS — M19012 Primary osteoarthritis, left shoulder: Secondary | ICD-10-CM | POA: Insufficient documentation

## 2021-07-28 DIAGNOSIS — F32A Depression, unspecified: Secondary | ICD-10-CM | POA: Insufficient documentation

## 2021-07-28 DIAGNOSIS — K5730 Diverticulosis of large intestine without perforation or abscess without bleeding: Secondary | ICD-10-CM | POA: Insufficient documentation

## 2021-07-28 DIAGNOSIS — L570 Actinic keratosis: Secondary | ICD-10-CM | POA: Insufficient documentation

## 2021-07-28 DIAGNOSIS — M216X1 Other acquired deformities of right foot: Secondary | ICD-10-CM | POA: Insufficient documentation

## 2021-07-28 DIAGNOSIS — M109 Gout, unspecified: Secondary | ICD-10-CM | POA: Insufficient documentation

## 2021-07-28 DIAGNOSIS — M722 Plantar fascial fibromatosis: Secondary | ICD-10-CM

## 2021-07-28 DIAGNOSIS — K219 Gastro-esophageal reflux disease without esophagitis: Secondary | ICD-10-CM | POA: Insufficient documentation

## 2021-07-28 DIAGNOSIS — R918 Other nonspecific abnormal finding of lung field: Secondary | ICD-10-CM | POA: Insufficient documentation

## 2021-07-28 DIAGNOSIS — E119 Type 2 diabetes mellitus without complications: Secondary | ICD-10-CM | POA: Insufficient documentation

## 2021-07-28 DIAGNOSIS — C7A092 Malignant carcinoid tumor of the stomach: Secondary | ICD-10-CM | POA: Insufficient documentation

## 2021-07-28 DIAGNOSIS — R609 Edema, unspecified: Secondary | ICD-10-CM | POA: Insufficient documentation

## 2021-07-28 DIAGNOSIS — C7A8 Other malignant neuroendocrine tumors: Secondary | ICD-10-CM | POA: Insufficient documentation

## 2021-07-28 DIAGNOSIS — I5032 Chronic diastolic (congestive) heart failure: Secondary | ICD-10-CM | POA: Insufficient documentation

## 2021-07-28 DIAGNOSIS — L089 Local infection of the skin and subcutaneous tissue, unspecified: Secondary | ICD-10-CM | POA: Insufficient documentation

## 2021-07-28 DIAGNOSIS — Z01818 Encounter for other preprocedural examination: Secondary | ICD-10-CM | POA: Insufficient documentation

## 2021-07-28 DIAGNOSIS — R197 Diarrhea, unspecified: Secondary | ICD-10-CM | POA: Insufficient documentation

## 2021-07-28 DIAGNOSIS — L608 Other nail disorders: Secondary | ICD-10-CM | POA: Insufficient documentation

## 2021-07-28 MED ORDER — TRIAMCINOLONE ACETONIDE 10 MG/ML IJ SUSP
10.0000 mg | Freq: Once | INTRAMUSCULAR | Status: AC
Start: 2021-07-28 — End: 2021-07-28
  Administered 2021-07-28: 10 mg

## 2021-07-28 NOTE — Patient Instructions (Signed)

## 2021-07-28 NOTE — Progress Notes (Signed)
Subjective: Clinton Young is a 75 y.o. male patient presents to office with complaint of moderate heel pain on the right greater than left.  Patient admits to post static dyskinesia for many years and reports that sometimes the pain can get so bad 10 out of 10 is a patient at the [place] and was giving shoes and insoles but patient reports that they do not help.  Denies any other pedal complaints.   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   Morbid obesity ([place]) 07/28/2021   Multiple nodules of lung 07/28/2021   Non-pressure chronic ulcer of skin of other sites with unspecified severity ([place]) 07/28/2021   Other acquired deformities of right foot 07/28/2021   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   Other specified disease of nail 07/28/2021   Pain in right foot 07/28/2021   Plantar fascial fibromatosis 07/28/2021   Primary osteoarthritis, 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   Type 2 diabetes mellitus without complications ([place]) 44/31/5400   Acute on chronic respiratory failure with hypoxia ([place]) 01/21/2020   History of pancreatitis 01/21/2020   S/P angioplasty with stent 01/21/2020   Heart failure ([place]) 08/17/2019   Hypervolemia 08/17/2019   OSA (obstructive sleep apnea) 08/17/2019   Shortness of breath 08/17/2019   Angina of effort ([place]) 08/15/2019   Adenomatous polyp of colon 08/30/2017   CKD (chronic kidney disease), stage III ([place]) 12/05/2015   Coronary  artery disease involving native coronary artery of native heart with angina pectoris ([place]) 12/05/2015   Diabetic nephropathy associated with type 2 diabetes mellitus ([place]) 12/05/2015   Diabetic neuropathy ([place]) 12/05/2015   Diabetic polyneuropathy associated with type 2 diabetes mellitus ([place]) 12/05/2015   HLD (hyperlipidemia) 12/05/2015   Hypersomnia 12/05/2015   Pancreatitis 06/05/2013   Hypertension 06/05/2013   COPD (chronic obstructive pulmonary disease) ([place]) 07/26/2011   Diabetes mellitus ([place]) 07/26/2011    Current Outpatient Medications on File Prior to Visit  Medication Sig Dispense Refill   allopurinol (ZYLOPRIM) 100 MG tablet Take 200 mg by mouth daily.      aspirin 81 MG tablet Take 81 mg by mouth daily.       buPROPion (ZYBAN) 150 MG 12 hr tablet Take 150 mg by mouth 2 (two) times daily.       Cholecalciferol (VITAMIN D3) 1000 UNITS CAPS Take 2,000 Units by mouth daily.      flunisolide (NASALIDE) 0.025 % SOLN Inhale 2 sprays into the lungs 2 (two) times daily.       glipiZIDE (GLUCOTROL) 10 MG tablet Take 10 mg by mouth 2 (two) times daily before a meal.     IPRATROPIUM BROMIDE IN Inhale 17 mcg into the lungs.       Krill Oil 300 MG CAPS Take 300 mg by mouth 2 (two) times daily.     levofloxacin (LEVAQUIN) 750 MG tablet Take 1 tablet (750 mg total) by mouth daily. 7 tablet 0   lisinopril (PRINIVIL,ZESTRIL) 10 MG tablet Take 5 mg by mouth daily.     metFORMIN (  GLUCOPHAGE) 850 MG tablet Take 850 mg by mouth 2 (two) times daily with a meal.     metoprolol tartrate (LOPRESSOR) 25 MG tablet Take 1 tablet (25 mg total) by mouth 2 (two) times daily. 60 tablet 2   oxyCODONE-acetaminophen (PERCOCET/ROXICET) 5-325 MG per tablet Take 1-2 tablets by mouth every 6 (six) hours as needed. 30 tablet 0   pravastatin (PRAVACHOL) 40 MG tablet Take 40 mg by mouth daily. 1/2 tablet      No current facility-administered medications on file prior to visit.    Allergies  Allergen Reactions    Metformin     Objective: Physical Exam General: The patient is alert and oriented x3 in no acute distress.  Patient is on oxygen due to COPD history.  Dermatology: Skin is warm, dry and supple bilateral lower extremities. Nails 1-10 are normal. There is no erythema, edema, no eccymosis, no open lesions present.  Dry skin plantar heels.  Integument is otherwise unremarkable.  Vascular: Dorsalis Pedis pulse and Posterior Tibial pulse are 1/4 bilateral. Capillary fill time is immediate to all digits.  Neurological: Grossly intact to light touch bilateral.  Musculoskeletal: Tenderness to palpation at the medial and plantar central calcaneal tubercale and through the insertion of the plantar fascia on the left/right foot. No pain with compression of calcaneus bilateral. No pain with tuning fork to calcaneus bilateral. No pain with calf compression bilateral. There is decreased Ankle joint range of motion bilateral. All other joints range of motion within normal limits bilateral. Strength 5/5 in all groups bilateral.   Gait: Unassisted, Antalgic avoid weight on heels  Xray, Right/Left foot:  Normal osseous mineralization. Joint spaces preserved. No fracture/dislocation/boney destruction. Calcaneal spur present with mild thickening of plantar fascia worse on the right. No other soft tissue abnormalities or radiopaque foreign bodies.   Assessment and Plan: Problem List Items Addressed This Visit       Musculoskeletal and Integument   Calcaneal spur, right foot   Other Visit Diagnoses     Bilateral plantar fasciitis    -  Primary   Relevant Orders   DG Foot Complete Right   DG Foot Complete Left   Heel pain, bilateral           -Complete examination performed.  -Xrays reviewed -Discussed with patient in detail the condition of plantar fasciitis, how this occurs and general treatment options. Explained both conservative and surgical treatments.  -After oral consent and aseptic prep,  injected a mixture containing 1 ml of 2% plain lidocaine, 1 ml 0.5% plain marcaine, 0.5 ml of kenalog 10 and 0.5 ml of dexamethasone phosphate into left and right heel. Post-injection care discussed with patient.  -Advised patient to continue with good supportive shoes and refrain from walking barefoot in the house -Explained and dispensed to patient daily stretching exercises. -Recommend patient to ice affected area 1-2x daily. -Patient to return to office in 4 weeks for follow up or sooner if problems or questions arise.  Landis Martins, DPM

## 2021-09-01 ENCOUNTER — Encounter: Payer: Self-pay | Admitting: Sports Medicine

## 2021-09-01 ENCOUNTER — Other Ambulatory Visit: Payer: Self-pay

## 2021-09-01 ENCOUNTER — Ambulatory Visit (INDEPENDENT_AMBULATORY_CARE_PROVIDER_SITE_OTHER): Payer: Medicare Other | Admitting: Sports Medicine

## 2021-09-01 DIAGNOSIS — Z7689 Persons encountering health services in other specified circumstances: Secondary | ICD-10-CM | POA: Insufficient documentation

## 2021-09-01 DIAGNOSIS — M7731 Calcaneal spur, right foot: Secondary | ICD-10-CM

## 2021-09-01 DIAGNOSIS — M722 Plantar fascial fibromatosis: Secondary | ICD-10-CM | POA: Diagnosis not present

## 2021-09-01 DIAGNOSIS — M79671 Pain in right foot: Secondary | ICD-10-CM

## 2021-09-01 DIAGNOSIS — M79672 Pain in left foot: Secondary | ICD-10-CM

## 2021-09-01 NOTE — Progress Notes (Signed)
Subjective: Clinton Young is a 76 y.o. male patient returns office for follow-up evaluation of foot pain.  Patient reports that his right heel still hurts not sure if the injection helped or not states that he does not have any pain on his left heel but sometimes has stiffness in the ankle.  No other pedal complaints noted.  Patient Active Problem List   Diagnosis Date Noted   Persons encountering health services in other specified circumstances 09/01/2021   Actinic keratosis 07/28/2021   Calcaneal spur, right foot 07/28/2021   Chronic diastolic heart failure ([place]) 07/28/2021   Depression 07/28/2021   Diarrhea, unspecified 07/28/2021   Diverticulosis of sigmoid colon 07/28/2021   Edema, unspecified 07/28/2021   Encounter for other preprocedural examination 07/28/2021   Gastroesophageal reflux disease 07/28/2021   Gout 07/28/2021   Local infection of the skin and subcutaneous tissue, unspecified 07/28/2021   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   Morbid obesity ([place]) 07/28/2021   Multiple nodules of lung 07/28/2021   Non-pressure chronic ulcer of skin of other sites with unspecified severity ([place]) 07/28/2021   Other acquired deformities of right foot 07/28/2021   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   Other specified disease of nail 07/28/2021   Pain in right foot 07/28/2021   Plantar fascial fibromatosis 07/28/2021   Primary osteoarthritis, 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   Type 2 diabetes mellitus without complications ([place]) 83/15/1761   Acute on chronic respiratory failure with hypoxia ([place]) 01/21/2020   History of pancreatitis 01/21/2020   S/P angioplasty with stent 01/21/2020   Heart failure ([place]) 08/17/2019   Hypervolemia 08/17/2019   OSA (obstructive sleep apnea) 08/17/2019   Shortness of breath 08/17/2019   Angina of effort ([place]) 08/15/2019   Adenomatous polyp of colon 08/30/2017   CKD (chronic kidney disease), stage III ([place]) 12/05/2015    Coronary artery disease involving native coronary artery of native heart with angina pectoris ([place]) 12/05/2015   Diabetic nephropathy associated with type 2 diabetes mellitus ([place]) 12/05/2015   Diabetic neuropathy ([place]) 12/05/2015   Diabetic polyneuropathy associated with type 2 diabetes mellitus ([place]) 12/05/2015   HLD (hyperlipidemia) 12/05/2015   Hypersomnia 12/05/2015   Pancreatitis 06/05/2013   Hypertension 06/05/2013   COPD (chronic obstructive pulmonary disease) ([place]) 07/26/2011   Diabetes mellitus ([place]) 07/26/2011    Current Outpatient Medications on File Prior to Visit  Medication Sig Dispense Refill   allopurinol (ZYLOPRIM) 100 MG tablet Take 200 mg by mouth daily.      aspirin 81 MG tablet Take 81 mg by mouth daily.       buPROPion (ZYBAN) 150 MG 12 hr tablet Take 150 mg by mouth 2 (two) times daily.       Cholecalciferol (VITAMIN D3) 1000 UNITS CAPS Take 2,000 Units by mouth daily.      flunisolide (NASALIDE) 0.025 % SOLN Inhale 2 sprays into the lungs 2 (two) times daily.       glipiZIDE (GLUCOTROL) 10 MG tablet Take 10 mg by mouth 2 (two) times daily before a meal.     IPRATROPIUM BROMIDE IN Inhale 17 mcg into the lungs.       Krill Oil 300 MG CAPS Take 300 mg by mouth 2 (two) times daily.     levofloxacin (LEVAQUIN) 750 MG tablet Take 1 tablet (750 mg total) by mouth daily. 7 tablet 0   lisinopril (PRINIVIL,ZESTRIL) 10 MG tablet Take 5 mg by mouth daily.     metFORMIN (GLUCOPHAGE) 850 MG tablet Take  850 mg by mouth 2 (two) times daily with a meal.     metoprolol tartrate (LOPRESSOR) 25 MG tablet Take 1 tablet (25 mg total) by mouth 2 (two) times daily. 60 tablet 2   oxyCODONE-acetaminophen (PERCOCET/ROXICET) 5-325 MG per tablet Take 1-2 tablets by mouth every 6 (six) hours as needed. 30 tablet 0   pravastatin (PRAVACHOL) 40 MG tablet Take 40 mg by mouth daily. 1/2 tablet      No current facility-administered medications on file prior to visit.    Allergies  Allergen  Reactions   Metformin     Objective: Physical Exam General: The patient is alert and oriented x3 in no acute distress.  Patient is on oxygen due to COPD history.  Dermatology: Skin is warm, dry and supple bilateral lower extremities. Nails 1-10 are normal. There is no erythema, edema, no eccymosis, no open lesions present.  Dry skin plantar heels.  Integument is otherwise unremarkable.  Vascular: Dorsalis Pedis pulse and Posterior Tibial pulse are 1/4 bilateral. Capillary fill time is immediate to all digits.  Neurological: Grossly intact to light touch bilateral.  Musculoskeletal: Continued tenderness to palpation at the medial and plantar central calcaneal tubercale and through the insertion of the plantar fascia on the right greater than left.  No pain with compression of calcaneus bilateral. No pain with tuning fork to calcaneus bilateral. No pain with calf compression bilateral. There is decreased Ankle joint range of motion bilateral.  Pes cavus foot type.  All other joints range of motion within normal limits bilateral. Strength 5/5 in all groups bilateral.   X-rays reviewed from the [place] from September which is consistent with x-rays that I have done last visit showing a small calcaneal spur  Assessment and Plan: Problem List Items Addressed This Visit       Musculoskeletal and Integument   Calcaneal spur, right foot   Other Visit Diagnoses     Bilateral plantar fasciitis    -  Primary   Heel pain, bilateral       Right greater than left       -Complete examination performed.  -Xrays reviewed -Re-Discussed with patient in detail the condition of plantar fasciitis with history of diabetes and neuropathy, how this occurs and general treatment options. Explained both conservative and surgical treatments.  -Patient declined repeat injection at this time -Dispensed plantar fascial brace for the right foot most symptomatic to provide additional support throughout the  longitudinal arch and to decrease the stress and strain to the plantar fascia at its insertion -Advised patient to continue with daily stretching exercises. -Recommend patient to ice affected area daily as tolerated -Patient to return to office in 4-5 weeks for follow up or sooner if problems or questions arise.  Landis Martins, DPM

## 2022-04-12 ENCOUNTER — Other Ambulatory Visit: Payer: Self-pay | Admitting: *Deleted

## 2022-04-12 NOTE — Patient Outreach (Signed)
  Care Coordination   Initial Visit Note   04/12/2022 Name: Clinton Young MRN: [id number] DOB: 25-Sep-1945  Clinton Young is a 76 y.o. year old male who sees Clinton Young for primary care. I spoke with  Clinton Young by phone today.  What matters to the patients health and wellness today?  Clinton Young DOESN'T NEED THOSE SERVICES.  SDOH assessments and interventions completed:  No   Care Coordination Interventions Activated:  Yes  Care Coordination Interventions:  No, not indicated   Follow up plan: No further intervention required.   Encounter Outcome:  Pt. Refused   Priyal Musquiz C. Myrtie Neither, MSN, [facility] Gerontological Nurse Practitioner [facility] Care Management [phone]

## 2022-09-03 ENCOUNTER — Ambulatory Visit (INDEPENDENT_AMBULATORY_CARE_PROVIDER_SITE_OTHER): Payer: Medicare Other

## 2022-09-03 ENCOUNTER — Ambulatory Visit (INDEPENDENT_AMBULATORY_CARE_PROVIDER_SITE_OTHER): Payer: Medicare Other | Admitting: Podiatry

## 2022-09-03 DIAGNOSIS — M7751 Other enthesopathy of right foot: Secondary | ICD-10-CM | POA: Diagnosis not present

## 2022-09-03 DIAGNOSIS — M778 Other enthesopathies, not elsewhere classified: Secondary | ICD-10-CM

## 2022-09-03 NOTE — Progress Notes (Signed)
  Subjective:  Patient ID: Clinton Young, male    DOB: May 22, 1946,  MRN: [id number]  Chief Complaint  Patient presents with   Foot Pain    Right heel pain/ would like surgery on heel    77 y.o. male presents with the above complaint.  Patient presents with concern for right heel pain.  He has previously had a steroid injection by Dr. Cannon Kettle approximately 1 year ago says it was not effective.  He is interested in anything that can be done to alleviate his right heel pain.  He says the right heel has pain with any pressure as he stands.  He has tried many different types of inserts shoes that seen other doctors for this problem with no relief.   Review of Systems: Negative except as noted in the HPI. Denies N/V/F/Ch.   Objective:  There were no vitals filed for this visit. There is no height or weight on file to calculate BMI. Constitutional Well developed. Well nourished.  Vascular Dorsalis pedis pulses palpable bilaterally. Posterior tibial pulses palpable bilaterally. Capillary refill normal to all digits.  No cyanosis or clubbing noted. Pedal hair growth normal.  Neurologic Normal speech. Oriented to person, place, and time. Epicritic sensation to light touch grossly present bilaterally.  Dermatologic Nails well groomed and normal in appearance. No open wounds. No skin lesions.  Orthopedic: Normal joint ROM without pain or crepitus bilaterally. No visible deformities. Tender to palpation at direct central area of the plantar calcaneus tuberosity at the plantar calcaneal bursa right foot. No pain with calcaneal squeeze right. Ankle ROM diminished range of motion right. Silfvers[place]old Test: positive right.   Radiographs: XR 3 views AP lateral oblique taken 09/03/2022 taken and reviewed. No acute fractures or dislocations. No evidence of stress fracture.  Plantar heel spur absent. Posterior heel spur absent.   Assessment:   1. Bursitis of heel, right   2. Capsulitis of foot     Plan:  Patient was evaluated and treated and all questions answered.  Plantar calcaneal bursitis, right - XR reviewed as above.  - Educated on icing and stretching. Instructions given.  - Injection delivered to the plantar calcaneal bursa right foot - DME: Deferred DME as patient does not want any - Pharmacologic management: Patient unable to take meloxicam due to other medications -Discussed with her very limited surgical options and unfortunate the patient is a very poor surgical candidate would like to not be a candidate for surgery even if it were to be indicated.  Procedure: Injection plantar calcaneal bursa right foot Location: Right plantar calcaneal at the glabrous junction; medial approach. Skin Prep: alcohol Injectate: 1 cc 0.5% marcaine plain, 1 cc kenalog 10. Disposition: Patient tolerated procedure well. Injection site dressed with a band-aid.  Return in about 4 weeks (around 10/01/2022) for Follow-up right heel bursitis.

## 2022-10-04 ENCOUNTER — Ambulatory Visit (INDEPENDENT_AMBULATORY_CARE_PROVIDER_SITE_OTHER): Payer: Medicare Other | Admitting: Podiatry

## 2022-10-04 DIAGNOSIS — Z91199 Patient's noncompliance with other medical treatment and regimen due to unspecified reason: Secondary | ICD-10-CM

## 2022-10-04 NOTE — Progress Notes (Signed)
Pt was a no show for apt, Charge generated

## 2023-03-22 DIAGNOSIS — I361 Nonrheumatic tricuspid (valve) insufficiency: Secondary | ICD-10-CM | POA: Diagnosis not present

## 2023-05-31 DEATH — deceased
